# Patient Record
Sex: Female | Born: 1980
Health system: Southern US, Community
[De-identification: ages and names within clinical notes are randomized; demographics above are authoritative.]

## PROBLEM LIST (undated history)

## (undated) DIAGNOSIS — Q796 Ehlers-Danlos syndrome, unspecified: Secondary | ICD-10-CM

## (undated) DIAGNOSIS — F4310 Post-traumatic stress disorder, unspecified: Secondary | ICD-10-CM

## (undated) DIAGNOSIS — F1021 Alcohol dependence, in remission: Secondary | ICD-10-CM

## (undated) DIAGNOSIS — G4710 Hypersomnia, unspecified: Secondary | ICD-10-CM

## (undated) DIAGNOSIS — G47419 Narcolepsy without cataplexy: Secondary | ICD-10-CM

## (undated) DIAGNOSIS — F32A Depression, unspecified: Secondary | ICD-10-CM

## (undated) DIAGNOSIS — F329 Major depressive disorder, single episode, unspecified: Secondary | ICD-10-CM

## (undated) HISTORY — DX: Hypersomnia, unspecified: G47.10

## (undated) HISTORY — DX: Ehlers-Danlos syndrome, unspecified: Q79.60

## (undated) HISTORY — DX: Post-traumatic stress disorder, unspecified: F43.10

## (undated) HISTORY — DX: Alcohol dependence, in remission: F10.21

## (undated) HISTORY — DX: Depression, unspecified: F32.A

## (undated) HISTORY — DX: Narcolepsy without cataplexy: G47.419

## (undated) HISTORY — DX: Major depressive disorder, single episode, unspecified: F32.9

---

## 2008-03-18 ENCOUNTER — Encounter: Admission: RE | Admit: 2008-03-18 | Discharge: 2008-03-18 | Payer: Self-pay | Admitting: Family Medicine

## 2010-02-06 IMAGING — US US ABDOMEN COMPLETE
1 series · 14 of 25 positions shown · non-contrast
Comparison: None.

CLINICAL DATA: 27-year-old female nausea, evaluate for gallstones.

COMPLETE ABDOMINAL ULTRASOUND
TECHNIQUE: Complete abdominal ultrasound examination was performed
including evaluation of the liver, gallbladder, bile ducts,
pancreas, kidneys, spleen, IVC, and abdominal aorta.

[Series 1: us abdomen complete · 0.19mm/px · 14 of 79 slices shown]
[im 1/79]
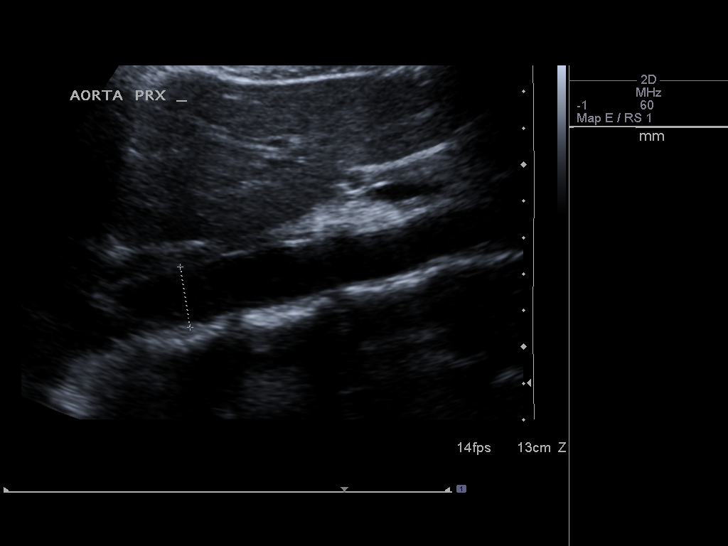
[im 7/79]
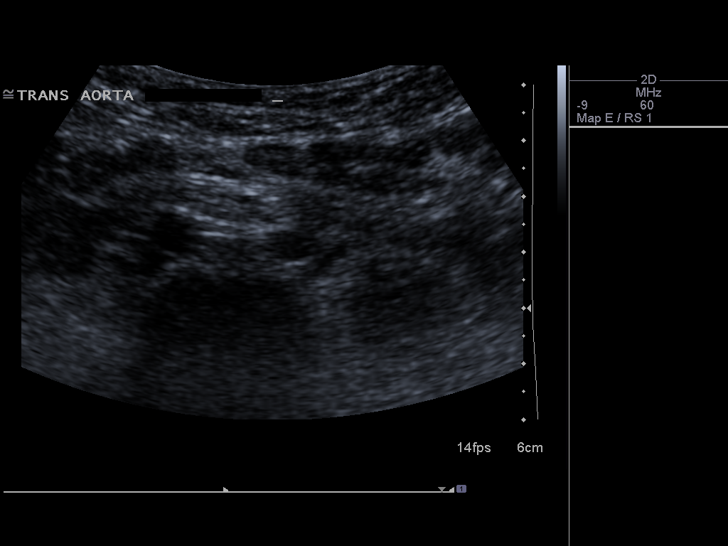
[im 14/79]
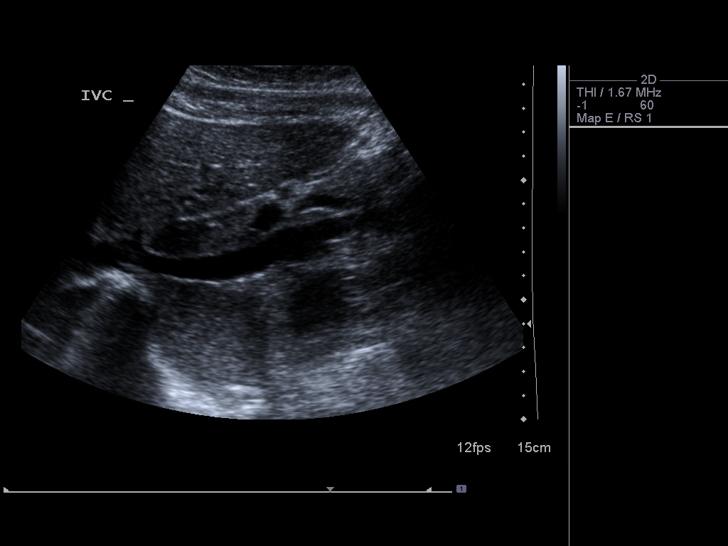
[im 20/79]
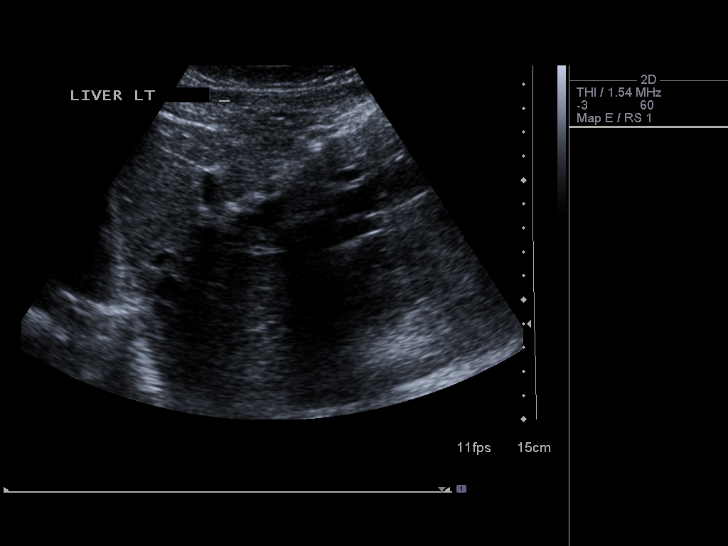
[im 27/79]
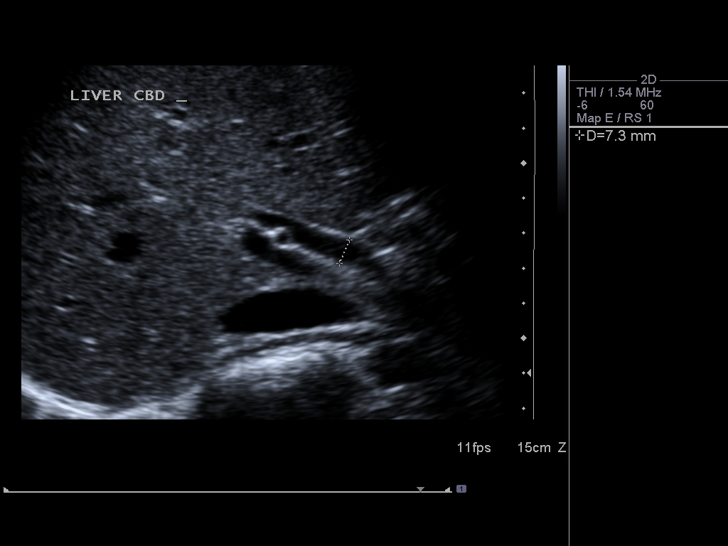
[im 30/79]
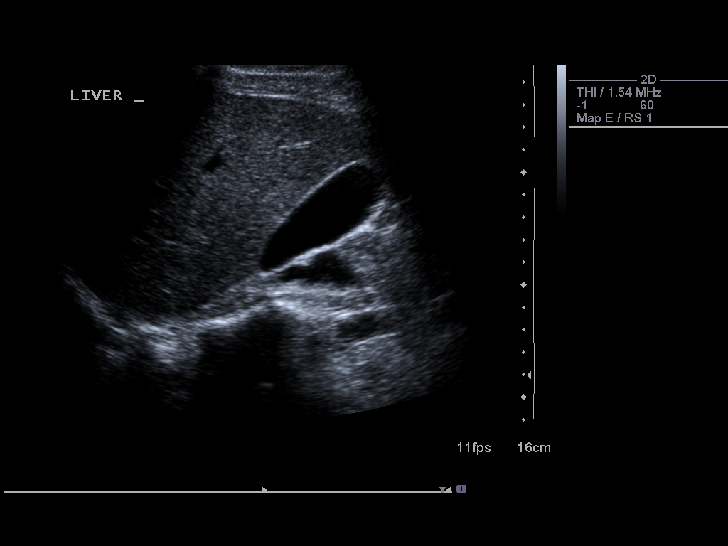
[im 36/79]
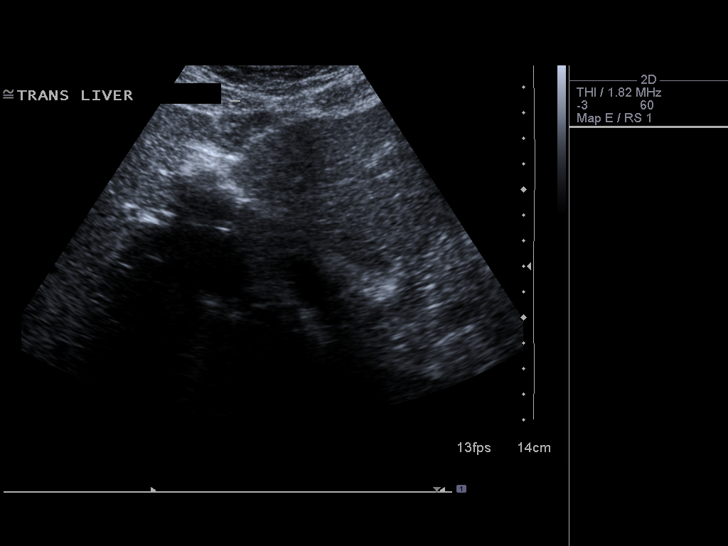
[im 43/79]
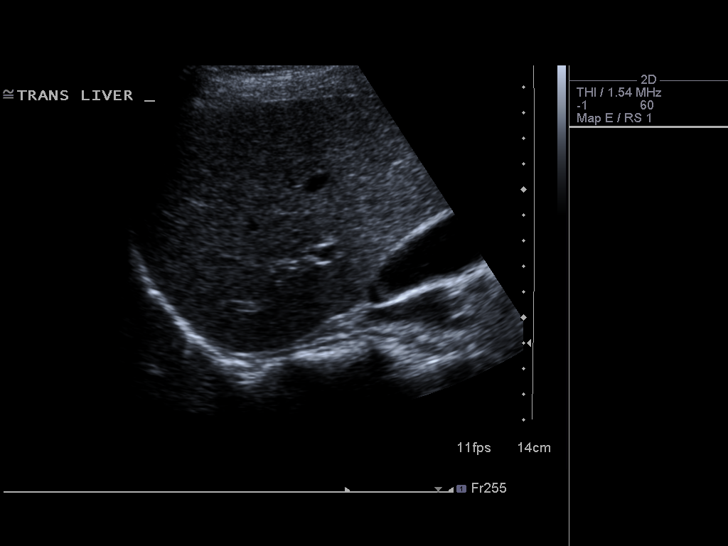
[im 49/79]
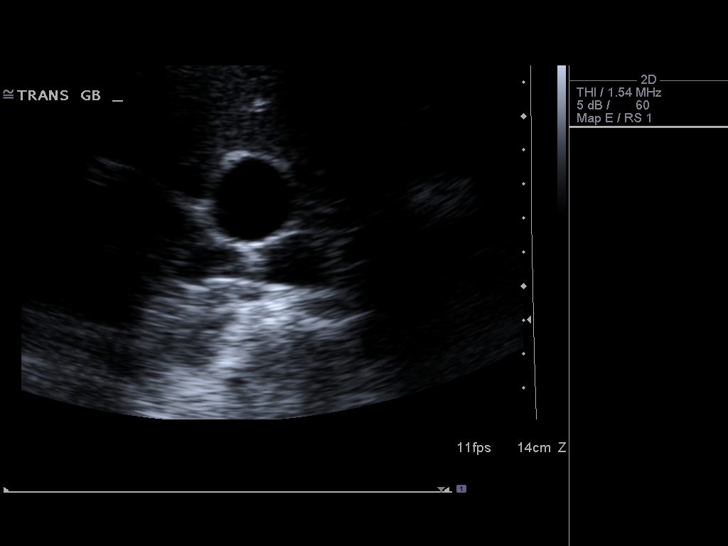
[im 53/79]
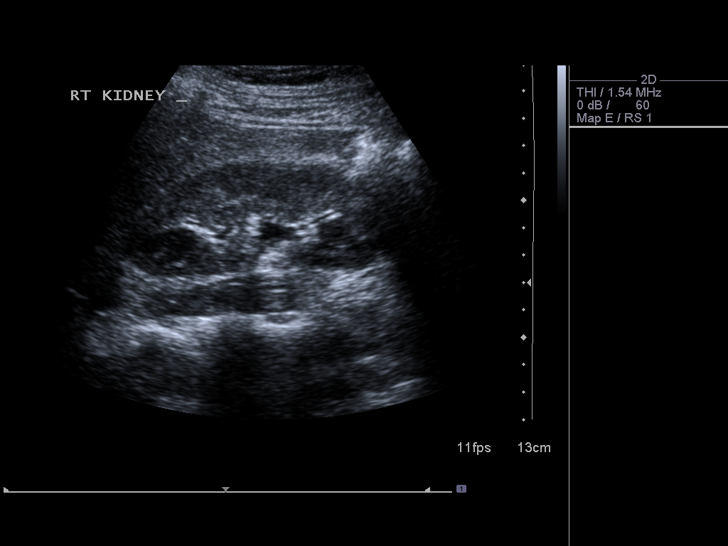
[im 59/79]
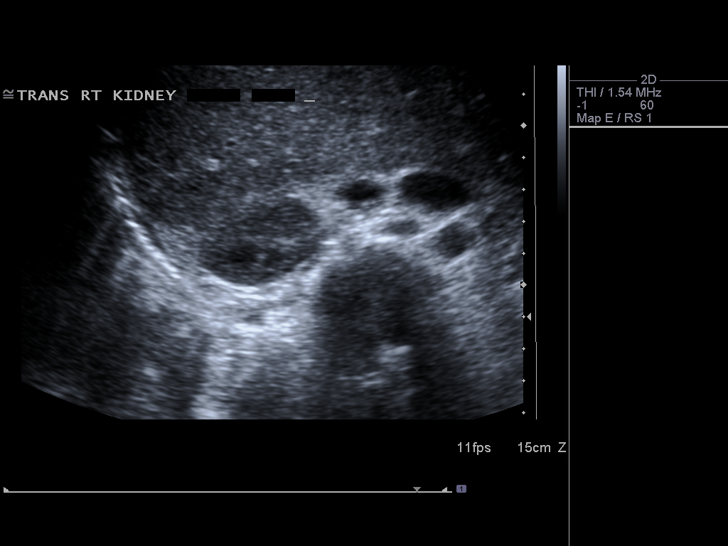
[im 66/79]
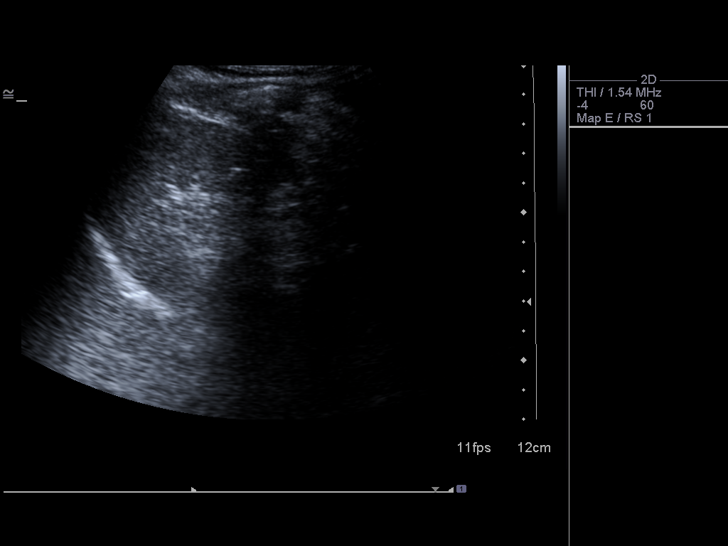
[im 72/79]
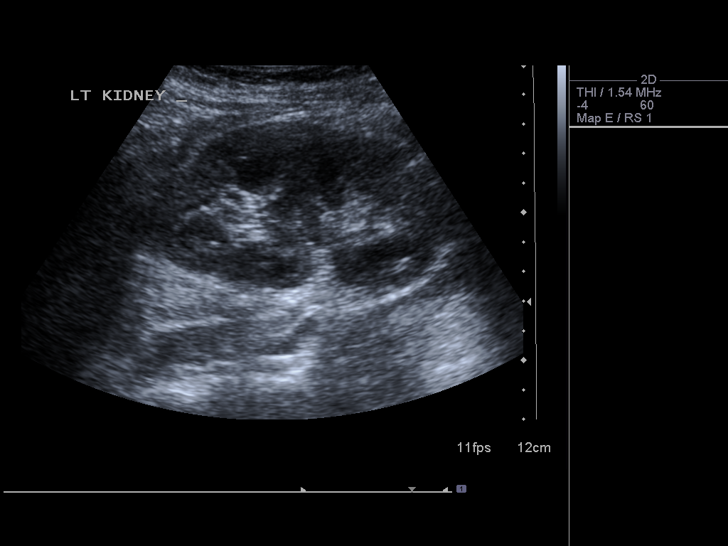
[im 79/79]
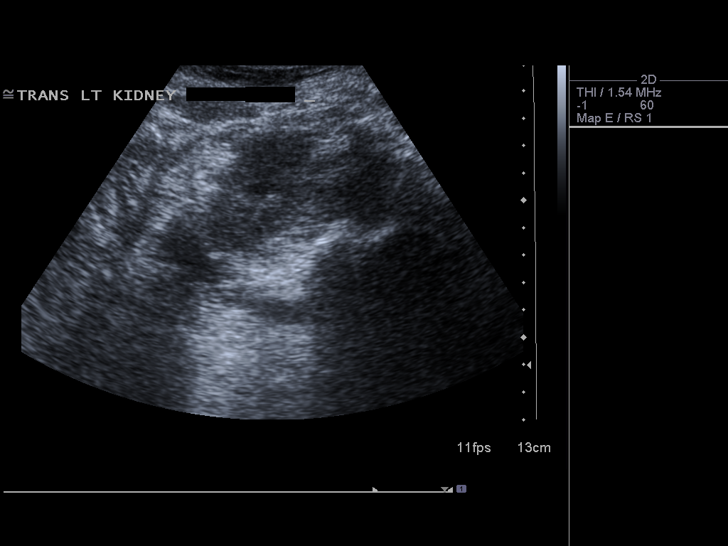

[14 of 25 positions shown; findings below may reference images not displayed]

FINDINGS: Gallbladder:  No gallstones, gallbladder wall thickening, or
pericholecystic fluid.

Common bile duct: Within normal limits in caliber.

Liver:  No focal parenchymal abnormalities.  Within normal limits
in parenchymal echogenicity.

Inferior vena cava:  Visualized portion unremarkable.

Pancreas:  Visualized portion unremarkable.

Spleen:  Within normal limits in size and echogenicity.

Right kidney:  Within normal limits in size and echogenicity. No
evidence of mass or hydronephrosis.

Left kidney:  Within normal limits in size and echogenicity. No
evidence of mass or hydronephrosis.

Abdominal aorta:  Within normal limits in caliber.
IMPRESSION: Negative abdominal ultrasound.

## 2010-10-07 HISTORY — PX: LASIK: SHX215

## 2012-08-22 ENCOUNTER — Other Ambulatory Visit: Payer: Self-pay

## 2012-08-22 MED ORDER — AMPHETAMINE-DEXTROAMPHETAMINE 20 MG PO TABS: ORAL_TABLET | ORAL | Status: DC

## 2012-08-22 NOTE — Telephone Encounter (Signed)
Patient called requesting a refill on Adderall.  Dr Vickey Huger is out of the office, forwarding request to Dr Terrace [facility]

## 2012-08-23 MED ORDER — AMPHETAMINE-DEXTROAMPHETAMINE 20 MG PO TABS: ORAL_TABLET | ORAL | Status: DC

## 2012-08-23 NOTE — Addendum Note (Signed)
Addended by: Lucille Passy C on: 08/23/2012 12:24 PM   Modules accepted: Orders

## 2012-08-23 NOTE — Telephone Encounter (Signed)
Rx did not print.  I reprinted it. 

## 2012-09-20 ENCOUNTER — Other Ambulatory Visit: Payer: Self-pay

## 2012-09-20 MED ORDER — AMPHETAMINE-DEXTROAMPHETAMINE 20 MG PO TABS: ORAL_TABLET | ORAL | Status: DC

## 2012-10-21 ENCOUNTER — Other Ambulatory Visit: Payer: Self-pay

## 2012-10-21 MED ORDER — AMPHETAMINE-DEXTROAMPHETAMINE 20 MG PO TABS: ORAL_TABLET | ORAL | Status: DC

## 2012-10-21 NOTE — Telephone Encounter (Signed)
Patient called, left message.  She would like a refill on Adderall.  She will pick Rx up when it's ready.

## 2012-11-21 ENCOUNTER — Encounter: Payer: Self-pay | Admitting: Neurology

## 2012-11-21 ENCOUNTER — Ambulatory Visit (INDEPENDENT_AMBULATORY_CARE_PROVIDER_SITE_OTHER): Payer: No Typology Code available for payment source | Admitting: Neurology

## 2012-11-21 VITALS — BP 115/77 | HR 88 | Resp 16 | Ht 63.0 in | Wt 113.0 lb

## 2012-11-21 DIAGNOSIS — Z5181 Encounter for therapeutic drug level monitoring: Secondary | ICD-10-CM

## 2012-11-21 DIAGNOSIS — G4710 Hypersomnia, unspecified: Secondary | ICD-10-CM | POA: Insufficient documentation

## 2012-11-21 LAB — COMPREHENSIVE METABOLIC PANEL
AST: 11 IU/L (ref 0–40)
Albumin: 4.6 g/dL (ref 3.5–5.5)
BUN: 17 mg/dL (ref 6–20)
CO2: 29 mmol/L (ref 18–29)
Calcium: 9.6 mg/dL (ref 8.7–10.2)
Creatinine, Ser: 0.97 mg/dL (ref 0.57–1.00)
Globulin, Total: 2.6 g/dL (ref 1.5–4.5)
Glucose: 88 mg/dL (ref 65–99)
Total Protein: 7.2 g/dL (ref 6.0–8.5)

## 2012-11-21 MED ORDER — SODIUM OXYBATE 500 MG/ML PO SOLN: 4500.0000 mg | Freq: Every day | ORAL | Status: DC

## 2012-11-21 MED ORDER — AMPHETAMINE-DEXTROAMPHETAMINE 20 MG PO TABS: ORAL_TABLET | ORAL | Status: DC

## 2012-11-21 NOTE — Progress Notes (Signed)
Quick Note:  All normal , please call. Arash Karstens, MD  ______

## 2012-11-21 NOTE — Progress Notes (Signed)
Chief Complaint  Patient presents with  . Hypersomnia    # 11 Revisit   [facility]  Provider:  Dr Vickey Huger Referring Provider: Henrine Screws, MD Primary Care Physician:  Gaye Alken, MD  Chief Complaint  Patient presents with  . Hypersomnia    # 11 Revisit    HPI:  Sherry Rhodes is a 32 y.o. female here originally  as a referral from Dr. Abigail Miyamoto for  possible narcolepsy.  She underwent testing by PSG and MSLT in 2009, and  Had no SREM onsets but a short sleep latency. She has been classified as a clinical Narcolepsy.   Patient had been taking Adderall for multiple decades before she without being evaluated for the possibility of narcolepsy. She had endorsed high fatigue scores and high Epworth sleepiness course at the time today her Epworth score is 11 points before the patient started on Xyrem in addition to Adderall her Epworth sleepiness score was 21/24 points.  The patient had been weaned off her stimulant medications as well as her antidepressants prior to the MSLT in August 2009 she had an AHI of 0.0 and the study was valid for an MS LT to follow ,  again there were no sleep onset REM periods , with a mean sleep latency of only 7.2 minutes. This was  indicative of a pathological level of sleepiness. She had to titrate slower than the average patient to her most effective Xyrem dose, and has now reached 4.5 g twice at night. She had nor desired effect on Provigil  or other modafinil preparations in the past and her Epworth score remained at 18 points and higher.     The patient's indicated that her regular bedtime is around 10 PM and she rises in the morning at 6 AM, on Xyrem she has at least 7 hours of nocturnal sleep she reports.  She has to the bathroom once before taking her second dose of Xyrem for the night. There has been no history of apnea and snoring, her PSG in 2009 documented  an AHI of 0.0 . No shift work history. Her  family  history of sleep disorders is positive for sleep apnea in a maternal grandmother and uncle.  She has never had trauma or surgery to the neck, jaw or sinuses. No TMJ , No alteration of the a upper airway. She wore braces in her teens, not for mandibular advancement. .     Review of Systems: Out of a complete 14 system review, the patient complains of only the following symptoms, and all other reviewed systems are negative. Sleepiness , epworth 11 .    Social History - Bachelor degree, working on her Master's degree in public affairs.  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   Family History  Problem Relation Age of Onset  . Parkinson's disease Maternal Grandmother   . Drug abuse Maternal Grandfather   . Cancer Paternal Grandmother   . Cancer Paternal Grandfather   . Drug abuse Paternal Grandfather    Past history-  Depression, controlled for over 10  years . Hypersomnia, persistent  Herpes genitalis.     Past Surgical History  Procedure Laterality Date  . Lasik Right 6/12    good success- vision correction    Current Outpatient Prescriptions  Medication Sig Dispense Refill  . acyclovir (ZOVIRAX) 200 MG capsule Take by mouth daily.      [name] amphetamine-dextroamphetamine (ADDERALL) 20 MG tablet Take  one tablet po QAM, one tablet at 12 noon and one half tablet at 4pm  75 tablet  0  . buPROPion (WELLBUTRIN XL) 300 MG 24 hr tablet Take 300 mg by mouth daily.      [name] lamoTRIgine (LAMICTAL) 100 MG tablet Take 100 mg by mouth daily.      [name] levonorgestrel (MIRENA) 20 MCG/24HR IUD 1 each by Intrauterine route once.      . loratadine (CLARITIN) 10 MG tablet Take 10 mg by mouth daily.      . Sodium Oxybate (XYREM) 500 MG/ML SOLN Take 9 mLs (4,500 mg total) by mouth daily.  270 mL  5   No current facility-administered medications for this visit.    Allergies as of 11/21/2012 - Review Complete 11/21/2012  Allergen Reaction Noted  . Bee  venom  11/21/2012    Vitals: BP 115/77  Pulse 88  Resp 16  Ht 5\' 3"  (1.6 m)  Wt 113 lb (51.256 kg)  BMI 20.02 kg/m2 Last Weight:  Wt Readings from Last 1 Encounters:  11/21/12 113 lb (51.256 kg)   Last Height:   Ht Readings from Last 1 Encounters:  11/21/12 5\' 3"  (1.6 m)   .  Physical exam:  General: The patient is awake, alert and appears not in acute distress. The patient is well groomed. Head: Normocephalic, atraumatic. Neck is supple. Mallampati 2 , neck circumference: 32 cm. No retrognathia , no TMJ.  Cardiovascular:  Regular rate and rhythm , without  murmurs or carotid bruit, and without distended neck veins. Respiratory: Lungs are clear to auscultation. Skin:  Without evidence of edema, or rash Trunk: BMI is normal posture.  Becks score 3 points.   Neurologic exam : The patient is awake and alert, oriented to place and time.  Memory subjective  described as intact. There is a normal attention span & concentration ability. Speech is fluent without  dysarthria, dysphonia or aphasia.  Mood and affect are appropriate.  Cranial nerves: Pupils are equal and briskly reactive to light. Funduscopic exam without  evidence of pallor or edema. Extraocular movements  in vertical and horizontal planes intact and without nystagmus. Visual fields by finger perimetry are intact. Hearing to finger rub intact.  Facial sensation intact to fine touch. Facial motor strength is symmetric and tongue and uvula move midline.  Motor exam:   Normal tone and normal muscle bulk and symmetric normal strength in all extremities.  Sensory:  Fine touch, pinprick and vibration were tested in all extremities. Proprioception is  normal.  Coordination: Rapid alternating movements in the fingers/hands is tested and normal. Finger-to-nose without evidence of ataxia, dysmetria or tremor.  Gait and station: Patient walks without assistive device . Deep tendon reflexes: in the  upper and lower extremities  are symmetric and intact.    Assessment:  After physical and neurologic examination, review of laboratory studies, PSG and MSLT - continue treatment for persistent hypersomnia, with the current use of Xyrem. The patient is still using Adderall as well.. She is using an immediate release 20 mg tablet of Adderall and longer the extended release form. Nuvigil  was without positive effect on the patient's sleepiness.   Plan:  Treatment plan and additional workup will be reviewed under Problem List. Continue XYREM 4.5 gram twice nightly -  Continue reg adderall for prn use.   Labs , CMET.  Information about narcolepsy and narcolepsy- like disorders provided.

## 2012-11-21 NOTE — Patient Instructions (Signed)
Narcolepsy Narcolepsy is a disabling neurological disorder of sleep regulation. It affects the control of sleep. It also affects the control of wakefulness. It is an interruption of the dreaming state of sleep. This state is known as REM or rapid eye movement sleep.  SYMPTOMS  The development, number, and severity of symptoms vary widely among people with the disorder. Symptoms generally begin between the ages of 15 and 30. The four classic symptoms of the disorder are:   Excessive daytime sleepiness.  Cataplexy. This is sudden, brief episodes of muscle weakness or paralysis. It is caused by strong emotions. Common strong emotions are laughter, anger, surprise, or anticipation.  Sleep paralysis. This is paralysis upon falling asleep or waking up.  Hallucinations. These are vivid dream-like images that occur at when you first fall asleep. Other symptoms include:   Unrelenting excessive sleepiness. This is usually the first and most obvious symptom.  Sleep attacks. Patients have strong sleep attacks throughout the day. These attacks can last for 30 seconds to more than 30 minutes. These happen no matter how much or how well the person slept the night before. These attacks end up making the person sleep at work and social events. The person can fall asleep while eating, talking, and driving. They also fall asleep at other out of place times.  Disturbed nighttime sleep.  Tossing and turning in bed.  Leg jerks.  Nightmares.  Waking up often. DIAGNOSIS  It's possible that genetics play a role in this disorder. Narcolepsy is not a rare disorder. It is often misdiagnosed. It is often diagnosed years after symptoms first appear. Early diagnosis and treatment are important. This help the physical and mental well-being of the patient. TREATMENT  There is no cure for narcolepsy. The symptoms can be controlled with behavioral and medical therapy. The excessive daytime sleepiness may be treated with  stimulant drugs. It may also be treated with the drug modafinil (Provigil). Cataplexy and other REM-sleep symptoms may be treated with antidepressant medications. Medications will reduce the symptoms. Medications will not ease symptoms entirely. Many available medications have side effects. Basic lifestyle changes may also reduce the symptoms. These changes include having regular sleep schedules and scheduled daytime naps. Other lifestyle changes include avoiding "over-stimulating" situations. Document Released: 04/14/2002 Document Revised: 07/17/2011 Document Reviewed: 04/24/2005 Exit[employer] Patient Information 2014 [employer].  

## 2012-11-22 NOTE — Progress Notes (Signed)
Quick Note:  Normal labs. Send patient a copy, please. ______ 

## 2012-11-25 NOTE — Progress Notes (Signed)
Quick Note:  Spoke to patient and relayed normal CMP, per Dr. Vickey Huger. ______

## 2012-12-24 ENCOUNTER — Other Ambulatory Visit: Payer: Self-pay

## 2012-12-24 DIAGNOSIS — G4710 Hypersomnia, unspecified: Secondary | ICD-10-CM

## 2012-12-24 MED ORDER — AMPHETAMINE-DEXTROAMPHETAMINE 20 MG PO TABS: ORAL_TABLET | ORAL | Status: DC

## 2012-12-24 NOTE — Telephone Encounter (Signed)
Patient called requesting a refill on Adderall.  She would like to pick up the Rx when it's ready.  Call back number [phone].  Dr Vickey Huger is out of the office.  Forwarding request to Dr Anne Hahn [facility]

## 2013-01-22 ENCOUNTER — Other Ambulatory Visit: Payer: Self-pay

## 2013-01-22 DIAGNOSIS — G4710 Hypersomnia, unspecified: Secondary | ICD-10-CM

## 2013-01-22 MED ORDER — AMPHETAMINE-DEXTROAMPHETAMINE 20 MG PO TABS: ORAL_TABLET | ORAL | Status: DC

## 2013-01-22 NOTE — Telephone Encounter (Signed)
Rx signed, ready for pick up.  I called the patient.  Got no answer.  Left message.  

## 2013-01-22 NOTE — Telephone Encounter (Signed)
Patient called requesting a refill on Adderall.  She would like to pick up the Rx when it's ready.  Call back number [phone]

## 2013-02-14 ENCOUNTER — Telehealth: Payer: Self-pay | Admitting: *Deleted

## 2013-02-14 NOTE — Telephone Encounter (Signed)
Calling for xyrem renewal.  Needing PA.   I called [phone] and they are to send PA form.

## 2013-02-19 ENCOUNTER — Telehealth: Payer: Self-pay

## 2013-02-19 NOTE — Telephone Encounter (Signed)
Patient called, left message inquiring about Xyrem. It requires a new prior auth since the cost of the medication has increased.  The prior auth has been faxed, and is pending ins response.  I called the patient back.  Got no answer.  Left message.

## 2013-02-20 ENCOUNTER — Other Ambulatory Visit: Payer: Self-pay

## 2013-02-20 DIAGNOSIS — G4710 Hypersomnia, unspecified: Secondary | ICD-10-CM

## 2013-02-20 NOTE — Telephone Encounter (Signed)
Patient called requesting a refill on Adderall.  She would like to pick up the Rx when it's ready.

## 2013-02-21 MED ORDER — AMPHETAMINE-DEXTROAMPHETAMINE 20 MG PO TABS: ORAL_TABLET | ORAL | Status: DC

## 2013-02-21 NOTE — Telephone Encounter (Signed)
Rx signed, ready for pick up.  I called the patient.  She is aware.  

## 2013-02-21 NOTE — Telephone Encounter (Signed)
Jess in pharmacy has spoken to pt yesterday.

## 2013-03-27 ENCOUNTER — Other Ambulatory Visit: Payer: Self-pay | Admitting: Neurology

## 2013-03-27 DIAGNOSIS — G4710 Hypersomnia, unspecified: Secondary | ICD-10-CM

## 2013-03-27 MED ORDER — AMPHETAMINE-DEXTROAMPHETAMINE 20 MG PO TABS: ORAL_TABLET | ORAL | Status: DC

## 2013-03-28 ENCOUNTER — Other Ambulatory Visit: Payer: Self-pay | Admitting: Neurology

## 2013-03-28 ENCOUNTER — Telehealth: Payer: Self-pay | Admitting: Neurology

## 2013-03-28 DIAGNOSIS — G4710 Hypersomnia, unspecified: Secondary | ICD-10-CM

## 2013-03-28 MED ORDER — AMPHETAMINE-DEXTROAMPHETAMINE 20 MG PO TABS: ORAL_TABLET | ORAL | Status: DC

## 2013-03-28 NOTE — Telephone Encounter (Signed)
Patient arrived at the office requesting refill, Dr. Vickey Huger printed one out and it was given to the patient. Patient signed for it and left.

## 2013-04-25 ENCOUNTER — Other Ambulatory Visit: Payer: Self-pay

## 2013-04-25 DIAGNOSIS — G4710 Hypersomnia, unspecified: Secondary | ICD-10-CM

## 2013-04-25 MED ORDER — AMPHETAMINE-DEXTROAMPHETAMINE 20 MG PO TABS: ORAL_TABLET | ORAL | Status: DC

## 2013-04-25 NOTE — Telephone Encounter (Signed)
Rx mailed.

## 2013-04-25 NOTE — Telephone Encounter (Signed)
Patient called requesting a refill on Adderall.  She would like to pick up te Rx when it's ready.  Call back number [phone]

## 2013-04-28 ENCOUNTER — Telehealth: Payer: Self-pay | Admitting: Neurology

## 2013-04-28 NOTE — Telephone Encounter (Signed)
By viewing the previous note, Rx said call patient for pick up, but it appears it was mailed in error.  I called the patient back.  Explianed Rx was mailed in error.  She said she would watch for it in the mail.

## 2013-04-28 NOTE — Telephone Encounter (Signed)
Patient calling to follow up on Adderall script. Checked up front and did not see a written script ready.

## 2013-05-27 ENCOUNTER — Encounter: Payer: Self-pay | Admitting: Neurology

## 2013-05-27 ENCOUNTER — Ambulatory Visit (INDEPENDENT_AMBULATORY_CARE_PROVIDER_SITE_OTHER): Payer: No Typology Code available for payment source | Admitting: Neurology

## 2013-05-27 ENCOUNTER — Encounter (INDEPENDENT_AMBULATORY_CARE_PROVIDER_SITE_OTHER): Payer: Self-pay

## 2013-05-27 VITALS — BP 109/68 | HR 91 | Resp 16 | Ht 64.5 in | Wt 115.0 lb

## 2013-05-27 DIAGNOSIS — G4710 Hypersomnia, unspecified: Secondary | ICD-10-CM

## 2013-05-27 DIAGNOSIS — Z5181 Encounter for therapeutic drug level monitoring: Secondary | ICD-10-CM

## 2013-05-27 DIAGNOSIS — G47419 Narcolepsy without cataplexy: Secondary | ICD-10-CM

## 2013-05-27 HISTORY — DX: Narcolepsy without cataplexy: G47.419

## 2013-05-27 MED ORDER — SODIUM OXYBATE 500 MG/ML PO SOLN: 4500.0000 mg | Freq: Every day | ORAL | Status: DC

## 2013-05-27 MED ORDER — AMPHETAMINE-DEXTROAMPHETAMINE 20 MG PO TABS: ORAL_TABLET | ORAL | Status: DC

## 2013-05-27 NOTE — Patient Instructions (Signed)
Narcolepsy Narcolepsy is a disabling neurological disorder of sleep regulation. It affects the control of sleep. It also affects the control of wakefulness. It is an interruption of the dreaming state of sleep. This state is known as REM or rapid eye movement sleep.  SYMPTOMS  The development, number, and severity of symptoms vary widely among people with the disorder. Symptoms generally begin between the ages of 15 and 30. The four classic symptoms of the disorder are:   Excessive daytime sleepiness.  Cataplexy. This is sudden, brief episodes of muscle weakness or paralysis. It is caused by strong emotions. Common strong emotions are laughter, anger, surprise, or anticipation.  Sleep paralysis. This is paralysis upon falling asleep or waking up.  Hallucinations. These are vivid dream-like images that occur at when you first fall asleep. Other symptoms include:   Unrelenting excessive sleepiness. This is usually the first and most obvious symptom.  Sleep attacks. Patients have strong sleep attacks throughout the day. These attacks can last for 30 seconds to more than 30 minutes. These happen no matter how much or how well the person slept the night before. These attacks end up making the person sleep at work and social events. The person can fall asleep while eating, talking, and driving. They also fall asleep at other out of place times.  Disturbed nighttime sleep.  Tossing and turning in bed.  Leg jerks.  Nightmares.  Waking up often. DIAGNOSIS  It's possible that genetics play a role in this disorder. Narcolepsy is not a rare disorder. It is often misdiagnosed. It is often diagnosed years after symptoms first appear. Early diagnosis and treatment are important. This help the physical and mental well-being of the patient. TREATMENT  There is no cure for narcolepsy. The symptoms can be controlled with behavioral and medical therapy. The excessive daytime sleepiness may be treated with  stimulant drugs. It may also be treated with the drug modafinil (Provigil). Cataplexy and other REM-sleep symptoms may be treated with antidepressant medications. Medications will reduce the symptoms. Medications will not ease symptoms entirely. Many available medications have side effects. Basic lifestyle changes may also reduce the symptoms. These changes include having regular sleep schedules and scheduled daytime naps. Other lifestyle changes include avoiding "over-stimulating" situations. Document Released: 04/14/2002 Document Revised: 07/17/2011 Document Reviewed: 04/24/2005 Exit[employer] Patient Information 2014 [employer].  

## 2013-05-27 NOTE — Addendum Note (Signed)
Addended by: Melvyn NovasHMEIER, Morine Kohlman on: 05/27/2013 01:51 PM   Modules accepted: Orders

## 2013-05-27 NOTE — Addendum Note (Signed)
Addended by: Melvyn NovasHMEIER, Rayneisha Bouza on: 05/27/2013 01:53 PM   Modules accepted: Orders

## 2013-05-27 NOTE — Progress Notes (Signed)
[facility]  Provider:  Larey Seat, M D  Referring Provider: Randa Lynn* Primary Care Physician:  Gerrit Heck, MD  Chief Complaint  Patient presents with  . Follow-up    Room 11              [facility]  Provider:  Dr Brett Fairy Referring Provider: Randa Lynn* Primary Care Physician:  Gerrit Heck, MD  Chief Complaint  Patient presents with  . Follow-up    Room 11    HPI:  Sherry Rhodes is a 33 y.o. female here originally  as a referral from Dr. Drema Dallas for  possible narcolepsy.   She underwent testing by PSG and MSLT in 2009, and  Had no SREM onsets but a very  short sleep latency. She has been classified as a clinical Narcolepsy, controlled on XYREM.[name]   Patient had been taking Adderall for multiple decades before she was  being evaluated for the possibility of narcolepsy.  She had endorsed high fatigue scores and high Epworth sleepiness course at the time today her Epworth score is 11 points.  Before the patient started on Xyrem in addition to Adderall her Epworth sleepiness score was 21/24 points.  The patient had been weaned off her stimulant medications as well as her antidepressants prior to the MSLT in August 2009 she had an AHI of 0.0 and the study was valid for an MS LT to follow ,  again there were no sleep onset REM periods , with a mean sleep latency of only 7.2 minutes. This was  indicative of a pathological level of sleepiness. She had to titrate slower than the average patient to her most effective Xyrem dose, and has now reached 4.5 g twice at night. She had nor desired effect on Provigil  or other modafinil preparations in the past and her Epworth score remained at 18 points and higher.     Todays visit : The patient and doors the Epworth Sleepiness Scale at 12 points at the vertex and severity as for her was endorsed at 33 points. Her last laboratory tests  revealed no hepatic abnormalities there was actually no abnormality seen at all a very good glomerular filtration rate was noted normal sodium levels potassium, chloride, CO2, albumin, AST and ALT. Pupils was 88 mg/dL and a fasting test. The review of systems does not and Doris problems or side effects and they have been nor hospitalizations or injuries or surgeries.   Medication list was endorsed for Adderall 20 mg she is taking the Adderall q d,  she has a half tablet optional at 4 PM. Wellbutrin 300 mg preferred as 150 mg 2 tablets per morning.  Mirena  Daily, Claritin when necessary  and Xyrem as taken at 9 ml or 4.5 g twice nightly.   She has not had vivid dreams as frequently or as violent as in the past, since being on XYREM.   No cataplexy has ever been experienced.    The patient's indicated that her regular bedtime is around 10 PM and she rises in the morning at 6 AM, on Xyrem she has at least 7 hours of nocturnal sleep she reports.  She has to the bathroom once before taking her second dose of Xyrem for the night. There has been no history of apnea and snoring, her PSG in 2009 documented  an AHI of 0.0 . No shift work history. Her  family history of sleep disorders is positive for sleep apnea in a  maternal grandmother and uncle.  She has never had trauma or surgery to the neck, jaw or sinuses.  No TMJ , No alteration of the a upper airway. She wore braces in her teens, not for mandibular advancement. .     Review of Systems: Out of a complete 14 system review, the patient complains of only the following symptoms, and all other reviewed systems are negative. Sleepiness , epworth 12 . FSS 43.   Social History - Bachelor degree, working on her Master's degree in public affairs.  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   Family History  Problem Relation Age of Onset  . Parkinson's disease Maternal Grandmother   . Drug abuse Maternal  Grandfather   . Cancer Paternal Grandmother   . Cancer Paternal Grandfather   . Drug abuse Paternal Grandfather    Past history-  Depression, controlled for over 10  years . Hypersomnia, persistent  Herpes genitalis.     Past Surgical History  Procedure Laterality Date  . Lasik Right 6/12    good success- vision correction    Current Outpatient Prescriptions  Medication Sig Dispense Refill  . acyclovir (ZOVIRAX) 200 MG capsule Take by mouth daily.      [name] amphetamine-dextroamphetamine (ADDERALL) 20 MG tablet Take one tablet po QAM, one tablet at 12 noon and one half tablet at 4pm  75 tablet  0  . buPROPion (WELLBUTRIN XL) 300 MG 24 hr tablet Take 300 mg by mouth daily.      [name] lamoTRIgine (LAMICTAL) 100 MG tablet Take 100 mg by mouth daily.      [name] levonorgestrel (MIRENA) 20 MCG/24HR IUD 1 each by Intrauterine route once.      . loratadine (CLARITIN) 10 MG tablet Take 10 mg by mouth daily.      . Sodium Oxybate (XYREM) 500 MG/ML SOLN Take 9 mLs (4,500 mg total) by mouth daily.  270 mL  5   No current facility-administered medications for this visit.    Allergies as of 05/27/2013 - Review Complete 05/27/2013  Allergen Reaction Noted  . Bee venom  11/21/2012    Vitals: BP 109/68  Pulse 91  Resp 16  Ht 5' 4.5" (1.638 m)  Wt 115 lb (52.164 kg)  BMI 19.44 kg/m2 Last Weight:  Wt Readings from Last 1 Encounters:  05/27/13 115 lb (52.164 kg)   Last Height:   Ht Readings from Last 1 Encounters:  05/27/13 5' 4.5" (1.638 m)   .  Physical exam:  General: The patient is awake, alert and appears not in acute distress. The patient is well groomed. Head: Normocephalic, atraumatic. Neck is supple. Mallampati 2 , neck circumference: 32 cm. No retrognathia , no TMJ.  Cardiovascular:  Regular rate and rhythm , without  murmurs or carotid bruit, and without distended neck veins. Respiratory: Lungs are clear to auscultation. Skin:  Without evidence of edema, or rash, no edema from  XYREM.  Trunk: BMI is normal , as is posture.  Beck's depression score 4 points.   Neurologic exam : The patient is awake and alert, oriented to place and time.  Memory subjective  described as intact. There is a normal attention span & concentration ability.  Speech is fluent without  dysarthria, dysphonia or aphasia.  Mood and affect are appropriate.  Cranial nerves: Pupils are equal and briskly reactive to light. Funduscopic exam without  evidence of pallor or edema.  Extraocular movements  in vertical  and horizontal planes intact and without nystagmus. Visual fields by finger perimetry are intact. Hearing to finger rub intact.  Facial sensation intact to fine touch. Facial motor strength is symmetric and tongue and uvula move midline.  Motor exam:   Normal tone and normal muscle bulk and symmetric normal strength in all extremities. Good grip strength.   Sensory:  Fine touch, pinprick and vibration were tested in all extremities. Proprioception is normal.  Coordination: Rapid alternating movements in the fingers/hands is tested and normal.  Finger-to-nose without evidence of ataxia, dysmetria or tremor.  Gait and station: Patient walks inimpaired, well balanced.  Deep tendon reflexes: in the  upper and lower extremities are symmetric and intact.    Assessment:  After physical and neurologic examination, review of laboratory studies, PSG and MSLT - continue treatment for persistent hypersomnia, with the current use of Xyrem.  The patient is still using Adderall as well.. She is using an immediate release 20 mg tablet of Adderall and longer the extended release form. Nuvigil  was without positive effect on the patient's sleepiness.   Plan:   Treatment plan and additional workup will be reviewed under Problem List. Continue XYREM 4.5 gram twice nightly -  Continue reg adderall for prn use.   Labs , CMET.  Information about narcolepsy and of narcolepsy- like disorders provided.    HLA test dicussed.

## 2013-06-24 ENCOUNTER — Other Ambulatory Visit: Payer: Self-pay | Admitting: *Deleted

## 2013-06-24 DIAGNOSIS — G4710 Hypersomnia, unspecified: Secondary | ICD-10-CM

## 2013-06-24 MED ORDER — AMPHETAMINE-DEXTROAMPHETAMINE 20 MG PO TABS: ORAL_TABLET | ORAL | Status: DC

## 2013-06-25 NOTE — Telephone Encounter (Signed)
Called patient and left message informing her that her Rx was ready to be picked up at the front desk and if she has any other problems, questions or concerns to call the office. °

## 2013-07-28 ENCOUNTER — Other Ambulatory Visit: Payer: Self-pay | Admitting: Neurology

## 2013-07-28 DIAGNOSIS — G4710 Hypersomnia, unspecified: Secondary | ICD-10-CM

## 2013-07-28 MED ORDER — AMPHETAMINE-DEXTROAMPHETAMINE 20 MG PO TABS: ORAL_TABLET | ORAL | Status: DC

## 2013-07-28 NOTE — Telephone Encounter (Signed)
Patient requesting refill of written script of Adderall, quantity of 75. Please call patient when ready for pick up.

## 2013-07-28 NOTE — Telephone Encounter (Signed)
Dr Dohmeier is out of the office.  Forwarding request to [facility].   

## 2013-07-29 NOTE — Telephone Encounter (Signed)
Patient was notified that her rx is ready for pickup at the front desk.  Patient verbalized understanding. 

## 2013-08-25 ENCOUNTER — Other Ambulatory Visit: Payer: Self-pay | Admitting: Neurology

## 2013-08-25 DIAGNOSIS — G4710 Hypersomnia, unspecified: Secondary | ICD-10-CM

## 2013-08-25 MED ORDER — AMPHETAMINE-DEXTROAMPHETAMINE 20 MG PO TABS: ORAL_TABLET | ORAL | Status: DC

## 2013-08-25 NOTE — Telephone Encounter (Signed)
Called pt and left message informing her that her Rx was ready to be picked up at the front desk and if she has any other problems, questions or concerns to call the office.  °

## 2013-08-25 NOTE — Telephone Encounter (Signed)
Pt called to get her written prescription for amphetamine-dextroamphetamine (ADDERALL) 20 MG tablet please call pt when ready for pick up. Thanks

## 2013-09-26 ENCOUNTER — Other Ambulatory Visit: Payer: Self-pay | Admitting: Nurse Practitioner

## 2013-09-26 DIAGNOSIS — G4710 Hypersomnia, unspecified: Secondary | ICD-10-CM

## 2013-09-26 NOTE — Telephone Encounter (Signed)
Pt requesting refill of amphetamine-dextroamphetamine (ADDERALL) 20 MG tablet.  Please call when ready for pick up. Thanks

## 2013-09-30 MED ORDER — AMPHETAMINE-DEXTROAMPHETAMINE 20 MG PO TABS: ORAL_TABLET | ORAL | Status: DC

## 2013-09-30 NOTE — Telephone Encounter (Signed)
Called pt to inform her that her Rx was ready to be picked up at the front desk and if she has any other problems, questions or concerns to call the office. Pt verbalized understanding. °

## 2013-10-15 ENCOUNTER — Other Ambulatory Visit: Payer: Self-pay | Admitting: Neurology

## 2013-10-15 DIAGNOSIS — G47419 Narcolepsy without cataplexy: Secondary | ICD-10-CM

## 2013-10-15 DIAGNOSIS — G4710 Hypersomnia, unspecified: Secondary | ICD-10-CM

## 2013-10-15 MED ORDER — SODIUM OXYBATE 500 MG/ML PO SOLN: 4500.0000 mg | Freq: Every day | ORAL | Status: DC

## 2013-10-15 NOTE — Addendum Note (Signed)
Addended by: Demetrios Loll on: 10/15/2013 11:06 AM   Modules accepted: Orders

## 2013-11-03 ENCOUNTER — Other Ambulatory Visit: Payer: Self-pay | Admitting: Neurology

## 2013-11-03 DIAGNOSIS — G4710 Hypersomnia, unspecified: Secondary | ICD-10-CM

## 2013-11-03 MED ORDER — AMPHETAMINE-DEXTROAMPHETAMINE 20 MG PO TABS: ORAL_TABLET | ORAL | Status: DC

## 2013-11-03 NOTE — Telephone Encounter (Signed)
Patient calling to get a written Rx for Adderall 20mg  #75--please call when ready for pick up--thank you.

## 2013-11-03 NOTE — Telephone Encounter (Signed)
Please advise 

## 2013-11-03 NOTE — Telephone Encounter (Signed)
Request forwarded to Makalia Bare for approval  

## 2013-11-03 NOTE — Telephone Encounter (Signed)
Called pt and left message informing her that her Rx was ready to be picked up at the front desk and if she has any other problems, questions or concerns to call the office.  °

## 2013-11-25 ENCOUNTER — Encounter: Payer: Self-pay | Admitting: Adult Health

## 2013-11-25 ENCOUNTER — Ambulatory Visit (INDEPENDENT_AMBULATORY_CARE_PROVIDER_SITE_OTHER): Payer: No Typology Code available for payment source | Admitting: Adult Health

## 2013-11-25 VITALS — BP 112/73 | HR 92 | Ht 64.5 in | Wt 111.0 lb

## 2013-11-25 DIAGNOSIS — G47419 Narcolepsy without cataplexy: Secondary | ICD-10-CM

## 2013-11-25 DIAGNOSIS — Z5181 Encounter for therapeutic drug level monitoring: Secondary | ICD-10-CM

## 2013-11-25 DIAGNOSIS — G4710 Hypersomnia, unspecified: Secondary | ICD-10-CM

## 2013-11-25 MED ORDER — AMPHETAMINE-DEXTROAMPHETAMINE 20 MG PO TABS: ORAL_TABLET | ORAL | Status: DC

## 2013-11-25 NOTE — Progress Notes (Signed)
PATIENT: Sherry Rhodes DOB: 08/07/1980  REASON FOR VISIT: follow up HISTORY FROM: patient  HISTORY OF PRESENT ILLNESS: Sherry Rhodes is a 33 year old female with a history of narcolepsy. She returns today for follow-up. She currently takes xyrem and it is working well however she is having some depression. She states that she has no motivation to do things. Her PCP placed on her Effexor and she is going to see if that helps.  Her Epworth score is 10 previously was 12 and fatigue severity score is 46 was previously 43. She goes to bed around 10pm and arises at 6am. Patient takes Adderall at 8am, 1200 and 4pm. She states that is currently working well for her. No new medical issues since last seen.    REVIEW OF SYSTEMS: Full 14 system review of systems performed and notable only for:  Constitutional: N/A  Eyes: N/A Ear/Nose/Throat: N/A  Skin: N/A  Cardiovascular: N/A  Respiratory: N/A  Gastrointestinal: N/A  Genitourinary: N/A Hematology/Lymphatic: N/A  Endocrine: N/A Musculoskeletal:N/A  Allergy/Immunology: N/A  Neurological: N/A Psychiatric: depression Sleep: N/A   ALLERGIES: Allergies  Allergen Reactions  . Bee Venom     HOME MEDICATIONS: Outpatient Prescriptions Prior to Visit  Medication Sig Dispense Refill  . acyclovir (ZOVIRAX) 200 MG capsule Take by mouth daily.      [name]. amphetamine-dextroamphetamine (ADDERALL) 20 MG tablet Take one tablet po QAM, one tablet at 12 noon and one half tablet at 4pm  75 tablet  0  . buPROPion (WELLBUTRIN XL) 300 MG 24 hr tablet Take 450 mg by mouth daily.       [name]. lamoTRIgine (LAMICTAL) 100 MG tablet Take 100 mg by mouth daily.      [name]. levonorgestrel (MIRENA) 20 MCG/24HR IUD 1 each by Intrauterine route once.      . loratadine (CLARITIN) 10 MG tablet Take 10 mg by mouth daily.      . Sodium Oxybate (XYREM) 500 MG/ML SOLN Take 9 mLs (4,500 mg total) by mouth daily. Take twice at night 4.5 gram , 4500 mg or 9 ml. .  540 mL  5   No  facility-administered medications prior to visit.    PAST MEDICAL HISTORY: Past Medical History  Diagnosis Date  . Recovering alcoholic   . Depression   . EDS (Ehlers-Danlos syndrome)     persistent excessive daytime sleepiness  . PTSD (post-traumatic stress disorder)   . Hypersomnia, persistent     MSLT no SREM but lass than 8 minutes SL.   [name]. Controlled narcolepsy 05/27/2013    PAST SURGICAL HISTORY: Past Surgical History  Procedure Laterality Date  . Lasik Right 6/12    good success- vision correction    FAMILY HISTORY: Family History  Problem Relation Age of Onset  . Parkinson's disease Maternal Grandmother   . Drug abuse Maternal Grandfather   . Cancer Paternal Grandmother   . Cancer Paternal Grandfather   . Drug abuse Paternal Grandfather     SOCIAL HISTORY: History   Social History  . Marital Status: Single    Spouse Name: N/A    Number of Children: 0  . Years of Education: Bachelors   Occupational History  .  [employer]   Social History Main Topics  . Smoking status: Former Games developermoker  . Smokeless tobacco: Never Used     Comment: quit smoking 3/10  . Alcohol Use: No     Comment: quit alcohol 02/2003-recovering alcohol abuser  . Drug Use: No  . Sexual Activity:  Not on file   Other Topics Concern  . Not on file   Social History Narrative       Patient is single, recovering ETOH abuser. Patient has her bachelors degree  Patient is working on her masters at [employer].. 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    Patient is right-handed.   Patient does not drink any caffeine.                  PHYSICAL EXAM  Filed Vitals:   11/25/13 1017  BP: 112/73  Pulse: 92  Height: 5' 4.5" (1.638 m)  Weight: 111 lb (50.349 kg)   Body mass index is 18.77 kg/(m^2).  Generalized: Well developed, in no acute distress   Neurological examination  Mentation: Alert oriented to time, place, history taking. Follows all commands speech and language fluent Cranial  nerve II-XII: . Extraocular movements were full, visual field were full on confrontational test.  Motor: The motor testing reveals 5 over 5 strength of all 4 extremities. Good symmetric motor tone is noted throughout.  Sensory: Sensory testing is intact to soft touch on all 4 extremities. No evidence of extinction is noted.  Coordination: Cerebellar testing reveals good finger-nose-finger and heel-to-shin bilaterally.  Gait and station: Gait is normal. Tandem gait is normal. Romberg is negative. No drift is seen.  Reflexes: Deep tendon reflexes are symmetric and normal bilaterally.    DIAGNOSTIC DATA (LABS, IMAGING, TESTING) - I reviewed patient records, labs, notes, testing and imaging myself where available.      Component Value Date/Time   NA 138 11/21/2012 1406   K 3.9 11/21/2012 1406   CL 100 11/21/2012 1406   CO2 29 11/21/2012 1406   GLUCOSE 88 11/21/2012 1406   BUN 17 11/21/2012 1406   CREATININE 0.97 11/21/2012 1406   CALCIUM 9.6 11/21/2012 1406   PROT 7.2 11/21/2012 1406   AST 11 11/21/2012 1406   ALT 11 11/21/2012 1406   ALKPHOS 52 11/21/2012 1406   BILITOT 0.2 11/21/2012 1406   GFRNONAA 78 11/21/2012 1406   GFRAA 89 11/21/2012 1406       ASSESSMENT AND PLAN 33 y.o. year old female  has a past medical history of Recovering alcoholic; Depression; EDS (Ehlers-Danlos syndrome); PTSD (post-traumatic stress disorder); Hypersomnia, persistent; and Controlled narcolepsy (05/27/2013). here with:  1. Narcolepsy 2. Hypersomnia  Narcolepsy is controlled with xyrem and Adderall. Patient however starting to experience some depression, she believes it is side effect of xyrem. Her PCP started Effexor. If the Effexor is not beneficial is controlled depression then xyrem may have to be discontinued. Patient advised to keep [place] informed as to how the Effexor is working for her. I will refill the Adderall today. Patient will get blood work while in the office today. Patient should follow-up in 6 months  or sooner if needed.   Butch Penny, MSN, NP-C 11/25/2013, 10:27 AM [facility] [address] [place], [place] [address] [phone]  Note: This document was prepared with digital dictation and possible smart phrase technology. Any transcriptional errors that result from this process are unintentional.

## 2013-11-25 NOTE — Patient Instructions (Signed)
Sodium Oxybate oral solution What is this medicine? SODIUM OXYBATE (SOE dee um OX i bate) is used to treat excessive sleepiness and cataplexy in patients with narcolepsy. Cataplexy causes a sudden muscle weakness due to a strong emotional response. This medicine is not available in retail pharmacies. Your doctor will enroll you in a program that will provide the drug to you. This medicine may be used for other purposes; ask your health care provider or pharmacist if you have questions. COMMON BRAND NAME(S): Xyrem What should I tell my health care provider before I take this medicine? They need to know if you have any of these conditions: -depression, psychosis, or other mood disorders -heart disease or high blood pressure -if you frequently drink alcohol containing beverages -if you have a history of drug or alcohol abuse -liver disease -lung disease or difficulty breathing -seizures -succinic semialdehyde dehydrogenase deficiency -thoughts of suicide -an unusual or allergic reaction to sodium oxybate, other medicines, foods, dyes, or preservatives -pregnant or trying to get pregnant -breast-feeding How should I use this medicine? Take this medicine by mouth. Follow the directions on the prescription label. Take this medicine on an empty stomach, at least 30 minutes before or 2 hours after food. Do not take with food. Do not take your medicine more often than directed. A special MedGuide will be given to you by the pharmacist with each prescription and refill. Be sure to read this information carefully each time. Talk to your pediatrician regarding the use of this medicine in children. Special care may be needed. Overdosage: If you think you have taken too much of this medicine contact a poison control center or emergency room at once. NOTE: This medicine is only for you. Do not share this medicine with others. What if I miss a dose? Skip the missed dose. If it is almost time for your next  dose, take only that dose. Allow at least two and one-half hours between each nightly dose. Do not take double or extra doses. What may interact with this medicine? Do not take this medicine with any of the following medications: -alcohol -barbiturates, like phenobarbital -medicines commonly used for anxiety, sedation or insomnia This medicine may also interact with the following medications: -bupropion -divalproex sodium -dronabinol or marijuana -medicines for psychosis or severe mood disturbances -muscle relaxants -other stimulants, although these are commonly used with sodium oxybate -prescription pain medicines, including tramadol -valproate or valproic acid This list may not describe all possible interactions. Give your health care provider a list of all the medicines, herbs, non-prescription drugs, or dietary supplements you use. Also tell them if you smoke, drink alcohol, or use illegal drugs. Some items may interact with your medicine. What should I watch for while using this medicine? The use of this medicine requires careful supervision. Visit your doctor or health care professional for regular checks on your progress. Do not suddenly stop taking this medicine if you have been taking it for a long time. Withdrawal symptoms may occur. Your doctor or health care professional may need to slowly stop your doses. This medicine may affect your concentration or function. Let your doctor or health care professional know if you have increased sleepiness or confusion during the day. This medicine causes sleep very quickly. You should only take this drug at bedtime, while in bed. Do not drive a car, operate heavy machinery or perform any activities that require mental alertness for at least 6 hours after taking this drug. Use extreme care in any such   daily activities until you know how this medicine affects you. Because alcohol may interfere with this medicine and may cause serious side effects,  you must avoid alcohol-containing beverages while on this medicine. Do not take this medicine along with sleep medicines or other drugs with strong sedative effects, serious side effects may occur. This medicine can be dangerous in overdose. If you take more than prescribed or take it by accident, get emergency medical help right away. What side effects may I notice from receiving this medicine? Side effects that you should report to your doctor or health care professional as soon as possible: -allergic reactions like skin rash, itching or hives, swelling of the face, lips, or tongue -breathing problems -confusion -fast, irregular heartbeat -increased blood pressure, particularly if you already have high blood pressure -memory loss -seizures -sleepwalking -tremors or shaking movements -urinary incontinence Side effects that usually do not require medical attention (report to your doctor or health care professional if they continue or are bothersome): -dizziness -drowsiness -headache -increased urination -nausea, vomiting or stomach upset -unusual dreams This list may not describe all possible side effects. Call your doctor for medical advice about side effects. You may report side effects to FDA at 1-800-FDA-1088. Where should I keep my medicine? Keep out of reach of children. This medicine can be abused. Keep your medicine in a safe place to protect it from theft. Do not share this medicine with anyone. Selling or giving away this medicine is dangerous and against the law. Store at room temperature between 15 and 30 degrees C (59 and 86 degrees F). This medicine may cause accidental overdose and death if it is taken by other adults, children, or pets. Flush any unused medicine down the toilet to reduce the chance of harm. Do not use the medicine after the expiration date. NOTE: This sheet is a summary. It may not cover all possible information. If you have questions about this medicine,  talk to your doctor, pharmacist, or health care provider.  2015, Elsevier/Gold Standard. (2012-11-20 15:45:13)  

## 2013-11-26 LAB — COMPREHENSIVE METABOLIC PANEL
A/G RATIO: 1.6 (ref 1.1–2.5)
ALT: 9 IU/L (ref 0–32)
AST: 11 IU/L (ref 0–40)
Albumin: 4.4 g/dL (ref 3.5–5.5)
Alkaline Phosphatase: 49 IU/L (ref 39–117)
BILIRUBIN TOTAL: 0.3 mg/dL (ref 0.0–1.2)
BUN/Creatinine Ratio: 14 (ref 8–20)
BUN: 15 mg/dL (ref 6–20)
CALCIUM: 9.4 mg/dL (ref 8.7–10.2)
CHLORIDE: 100 mmol/L (ref 96–108)
CO2: 28 mmol/L (ref 18–29)
Creatinine, Ser: 1.06 mg/dL — ABNORMAL HIGH (ref 0.57–1.00)
GFR, EST AFRICAN AMERICAN: 80 mL/min/{1.73_m2} (ref >59)
GFR, EST NON AFRICAN AMERICAN: 69 mL/min/{1.73_m2} (ref >59)
GLUCOSE: 86 mg/dL (ref 65–99)
Globulin, Total: 2.8 g/dL (ref 1.5–4.5)
POTASSIUM: 4.4 mmol/L (ref 3.5–5.2)
SODIUM: 138 mmol/L (ref 134–144)
TOTAL PROTEIN: 7.2 g/dL (ref 6.0–8.5)

## 2013-11-26 NOTE — Progress Notes (Signed)
Quick Note:  Shared normal labs thru VM message ______ 

## 2014-01-05 ENCOUNTER — Other Ambulatory Visit: Payer: Self-pay | Admitting: Adult Health

## 2014-01-05 DIAGNOSIS — G4710 Hypersomnia, unspecified: Secondary | ICD-10-CM

## 2014-01-05 MED ORDER — AMPHETAMINE-DEXTROAMPHETAMINE 20 MG PO TABS
ORAL_TABLET | ORAL | Status: DC
Start: 2014-01-05 — End: 2014-02-06

## 2014-01-05 NOTE — Telephone Encounter (Signed)
Patient requesting written script refill of Adderall 20 mg, quantity of 75. Please call patient and advise.

## 2014-01-05 NOTE — Telephone Encounter (Signed)
Rx entered and forwarded to provider for approval 

## 2014-01-06 NOTE — Telephone Encounter (Signed)
Called pt and left message informing pt that her Rx was ready to be picked up at the front desk and if she has any other problems, questions or concerns to call the office. °

## 2014-02-06 ENCOUNTER — Telehealth: Payer: Self-pay | Admitting: *Deleted

## 2014-02-06 ENCOUNTER — Telehealth: Payer: Self-pay

## 2014-02-06 DIAGNOSIS — G4710 Hypersomnia, unspecified: Secondary | ICD-10-CM

## 2014-02-06 MED ORDER — AMPHETAMINE-DEXTROAMPHETAMINE 20 MG PO TABS: ORAL_TABLET | ORAL | Status: DC

## 2014-02-06 NOTE — Telephone Encounter (Signed)
Called pt and left message informing her that her Rx was ready to be picked up at the front desk and that the office is closing at 12 pm today and if she can not make it, the office will reopen on Monday at 8 am and if she has any other problems, questions or concerns to call the office.

## 2014-02-06 NOTE — Telephone Encounter (Signed)
[employer] notified us they have approved our request for coverage on Xyrem effective until 07/31/2014 Ref # [id number]

## 2014-02-06 NOTE — Telephone Encounter (Signed)
Patient calling requesting a refill of Adderall 20 mg, written RX quantity of 75

## 2014-02-06 NOTE — Telephone Encounter (Signed)
I have refilled this medication. Its ready for pickup. Maida SaleCathy Rhodes is calling the patient to let her know.

## 2014-03-11 ENCOUNTER — Other Ambulatory Visit: Payer: Self-pay | Admitting: Neurology

## 2014-03-11 DIAGNOSIS — G4710 Hypersomnia, unspecified: Secondary | ICD-10-CM

## 2014-03-11 MED ORDER — AMPHETAMINE-DEXTROAMPHETAMINE 20 MG PO TABS: ORAL_TABLET | ORAL | Status: DC

## 2014-03-11 NOTE — Telephone Encounter (Signed)
Request entered, forwarded to provider for approval.  

## 2014-03-11 NOTE — Telephone Encounter (Signed)
Patient requesting refill of Adderall script, please call when ready for pick up.  °

## 2014-03-12 NOTE — Telephone Encounter (Signed)
I called the patient to let them know their Rx for Adderall was ready for pickup. Patient was instructed to bring Photo ID. 

## 2014-03-12 NOTE — Addendum Note (Signed)
Addended by: Arlis PortaHUGHES, Arvie Villarruel on: 03/12/2014 09:59 AM   Modules accepted: Orders, Medications

## 2014-04-20 ENCOUNTER — Telehealth: Payer: Self-pay | Admitting: Neurology

## 2014-04-20 NOTE — Telephone Encounter (Signed)
Patient is calling to get a written Rx for Adderall. Please call patient when ready for pickup.Thank you. °

## 2014-04-21 ENCOUNTER — Other Ambulatory Visit: Payer: Self-pay

## 2014-04-21 DIAGNOSIS — G4710 Hypersomnia, unspecified: Secondary | ICD-10-CM

## 2014-04-21 MED ORDER — AMPHETAMINE-DEXTROAMPHETAMINE 20 MG PO TABS: ORAL_TABLET | ORAL | Status: DC

## 2014-04-21 NOTE — Telephone Encounter (Signed)
I called the patient to let them know their Rx for Adderall was ready for pickup. Patient was instructed to bring Photo ID. 

## 2014-04-21 NOTE — Telephone Encounter (Signed)
Request forwarded to provider for approval  

## 2014-05-29 ENCOUNTER — Ambulatory Visit: Payer: No Typology Code available for payment source | Admitting: Adult Health

## 2014-06-01 ENCOUNTER — Other Ambulatory Visit: Payer: Self-pay | Admitting: Neurology

## 2014-06-01 ENCOUNTER — Telehealth: Payer: Self-pay

## 2014-06-01 DIAGNOSIS — G4710 Hypersomnia, unspecified: Secondary | ICD-10-CM

## 2014-06-01 MED ORDER — AMPHETAMINE-DEXTROAMPHETAMINE 20 MG PO TABS: ORAL_TABLET | ORAL | Status: DC

## 2014-06-01 NOTE — Telephone Encounter (Signed)
Called patient and informed Rx ready for pick up at front desk. Patient verbalized understanding.  

## 2014-06-01 NOTE — Telephone Encounter (Signed)
Request entered, forwarded to provider for approval.  

## 2014-06-01 NOTE — Telephone Encounter (Signed)
Patient is calling to get a written Rx for Adderall 20 mg quantity 75.  Please call.

## 2014-06-09 ENCOUNTER — Ambulatory Visit (INDEPENDENT_AMBULATORY_CARE_PROVIDER_SITE_OTHER): Payer: BLUE CROSS/BLUE SHIELD | Admitting: Adult Health

## 2014-06-09 ENCOUNTER — Encounter: Payer: Self-pay | Admitting: Adult Health

## 2014-06-09 VITALS — BP 105/67 | HR 96 | Ht 64.0 in | Wt 128.0 lb

## 2014-06-09 DIAGNOSIS — R251 Tremor, unspecified: Secondary | ICD-10-CM

## 2014-06-09 DIAGNOSIS — Z5181 Encounter for therapeutic drug level monitoring: Secondary | ICD-10-CM

## 2014-06-09 DIAGNOSIS — G47419 Narcolepsy without cataplexy: Secondary | ICD-10-CM

## 2014-06-09 NOTE — Progress Notes (Signed)
I agree with the assessment and plan as directed by NP .The patient is known to me .   Sanaz Scarlett, MD  

## 2014-06-09 NOTE — Patient Instructions (Signed)
Continue Xyrem.  Please speak with your PCP about effexor causing tremor and balance issues.  If depression worsens, please let us know, xyrem may need to be discontinued.  I will check blood work today and call you with results.

## 2014-06-09 NOTE — Progress Notes (Signed)
PATIENT: Sherry Rhodes DOB: 01/18/1981  REASON FOR VISIT: follow up- narcolepsy HISTORY FROM: patient  HISTORY OF PRESENT ILLNESS: Sherry Rhodes is a 34 year old female with a history of narcolepsy. She returns today for follow-up. She currently takes xyrem and it is working well. She does report that she has a hard time getting up in the morning. This is not a new finding. She states that prior to Xyrem she was not waking up easily and refreshed. She states that once she is up and moving she  feels awake.  At the last visit she reported some depression and had been started on Effexor. She reports that her depression has been under control with Effexor. However she has developed a significant tremor for the Effexor. Tremor is located in bilateral upper and lower extremities.  she also takes Adderall TID, she states that occasionally she will not take the 4:00 dose. Epworth is 8 was previosuly 10 and fatigue severity score is 49 was previously 46.   HISTORY 11/25/13: Sherry Rhodes is a 34 year old female with a history of narcolepsy. She returns today for follow-up. She currently takes xyrem and it is working well however she is having some depression. She states that she has no motivation to do things. Her PCP placed on her Effexor and she is going to see if that helps. Her Epworth score is 10 previously was 12 and fatigue severity score is 46 was previously 43. She goes to bed around 10pm and arises at 6am. Patient takes Adderall at 8am, 1200 and 4pm. She states that is currently working well for her. No new medical issues since last seen.    REVIEW OF SYSTEMS: Out of a complete 14 system review of symptoms, the patient complains only of the following symptoms, and all other reviewed systems are negative.  See HPI  ALLERGIES: Allergies  Allergen Reactions  . Bee Venom     HOME MEDICATIONS: Outpatient Prescriptions Prior to Visit  Medication Sig Dispense Refill  . acyclovir (ZOVIRAX) 400 MG  tablet   10  . amphetamine-dextroamphetamine (ADDERALL) 20 MG tablet Take one tablet po QAM, one tablet at 12 noon and one half tablet at 4pm 75 tablet 0  . buPROPion (WELLBUTRIN XL) 150 MG 24 hr tablet   5  . buPROPion (WELLBUTRIN XL) 300 MG 24 hr tablet Take 450 mg by mouth daily.     [name]. lamoTRIgine (LAMICTAL) 100 MG tablet Take 100 mg by mouth daily.    [name]. levonorgestrel (MIRENA) 20 MCG/24HR IUD 1 each by Intrauterine route once.    . Sodium Oxybate (XYREM) 500 MG/ML SOLN Take 9 mLs (4,500 mg total) by mouth daily. Take twice at night 4.5 gram , 4500 mg or 9 ml. . 540 mL 5  . venlafaxine XR (EFFEXOR-XR) 37.5 MG 24 hr capsule Take 37.5 mg by mouth daily. 4x day    . loratadine (CLARITIN) 10 MG tablet Take 10 mg by mouth daily.     No facility-administered medications prior to visit.    PAST MEDICAL HISTORY: Past Medical History  Diagnosis Date  . Recovering alcoholic   . Depression   . EDS (Ehlers-Danlos syndrome)     persistent excessive daytime sleepiness  . PTSD (post-traumatic stress disorder)   . Hypersomnia, persistent     MSLT no SREM but lass than 8 minutes SL.   [name]. Controlled narcolepsy 05/27/2013    PAST SURGICAL HISTORY: Past Surgical History  Procedure Laterality Date  . Lasik  Right 6/12    good success- vision correction    FAMILY HISTORY: Family History  Problem Relation Age of Onset  . Parkinson's disease Maternal Grandmother   . Drug abuse Maternal Grandfather   . Cancer Paternal Grandmother   . Cancer Paternal Grandfather   . Drug abuse Paternal Grandfather         PHYSICAL EXAM  Filed Vitals:   06/09/14 0916  BP: 105/67  Pulse: 96  Height:  (1.626 m)  Weight: 128 lb (58.06 kg)   Body mass index is 21.96 kg/(m^2).  Generalized: Well developed, in no acute distress   Neurological examination  Mentation: Alert oriented to time, place, history taking. Follows all commands speech and language fluent Cranial nerve II-XII: Pupils were equal  round reactive to light. Extraocular movements were full, visual field were full on confrontational test. Facial sensation and strength were normal. Uvula tongue midline. Head turning and shoulder shrug  were normal and symmetric. Motor: The motor testing reveals 5 over 5 strength of all 4 extremities. Good symmetric motor tone is noted throughout. Tremor noted in hands and legs bilaterally.  Sensory: Sensory testing is intact to soft touch on all 4 extremities. No evidence of extinction is noted.  Coordination: Cerebellar testing reveals good finger-nose-finger and heel-to-shin bilaterally.  Gait and station: Gait is normal. Tandem gait is slightly unsteady. Romberg is negative. No drift is seen.  Reflexes: Deep tendon reflexes are symmetric but hyperreflexic.     DIAGNOSTIC DATA (LABS, IMAGING, TESTING) - I reviewed patient records, labs, notes, testing and imaging myself where available.      Component Value Date/Time   NA 138 11/25/2013 1050   K 4.4 11/25/2013 1050   CL 100 11/25/2013 1050   CO2 28 11/25/2013 1050   GLUCOSE 86 11/25/2013 1050   BUN 15 11/25/2013 1050   CREATININE 1.06* 11/25/2013 1050   CALCIUM 9.4 11/25/2013 1050   PROT 7.2 11/25/2013 1050   AST 11 11/25/2013 1050   ALT 9 11/25/2013 1050   ALKPHOS 49 11/25/2013 1050   BILITOT 0.3 11/25/2013 1050   GFRNONAA 69 11/25/2013 1050   GFRAA 80 11/25/2013 1050      ASSESSMENT AND PLAN 34 y.o. year old female  has a past medical history of Recovering alcoholic; Depression; EDS (Ehlers-Danlos syndrome); PTSD (post-traumatic stress disorder); Hypersomnia, persistent; and Controlled narcolepsy (05/27/2013). here with:  1. Narcolepsy 2. Tremor d/t medication   Narcolepsy continues to be controlled with Xyrem and Adderall. I will check blood work today. She was recently started on Effexor and has developed a tremor. I have advised the patient to speak with her PCP regarding the side effects that she is experiencing with  Effexor. Her depression has been controlled with Effexor. She Denies any thoughts of harming herself or others. I explained that xyrem can exacerbate/cause symptoms of depression. If her symptoms worsen she should let us know, as xyrem may have to be discontinued. Patient verbalized understanding. She will follow-up in 6 months with Dr. Vickey Huger.   Butch Penny, MSN, NP-C 06/09/2014, 9:26 AM [facility] [address] [place], [place] [address] [phone]  Note: This document was prepared with digital dictation and possible smart phrase technology. Any transcriptional errors that result from this process are unintentional.

## 2014-06-10 LAB — COMPREHENSIVE METABOLIC PANEL
ALK PHOS: 53 IU/L (ref 39–117)
ALT: 17 IU/L (ref 0–32)
AST: 16 IU/L (ref 0–40)
Albumin/Globulin Ratio: 1.8 (ref 1.1–2.5)
Albumin: 4.4 g/dL (ref 3.5–5.5)
BILIRUBIN TOTAL: 0.3 mg/dL (ref 0.0–1.2)
BUN / CREAT RATIO: 12 (ref 8–20)
BUN: 11 mg/dL (ref 6–20)
CALCIUM: 9.3 mg/dL (ref 8.7–10.2)
CO2: 28 mmol/L (ref 18–29)
CREATININE: 0.94 mg/dL (ref 0.57–1.00)
Chloride: 100 mmol/L (ref 97–108)
GFR calc non Af Amer: 80 mL/min/{1.73_m2} (ref >59)
GFR, EST AFRICAN AMERICAN: 92 mL/min/{1.73_m2} (ref >59)
GLUCOSE: 84 mg/dL (ref 65–99)
Globulin, Total: 2.5 g/dL (ref 1.5–4.5)
POTASSIUM: 4 mmol/L (ref 3.5–5.2)
SODIUM: 141 mmol/L (ref 134–144)
TOTAL PROTEIN: 6.9 g/dL (ref 6.0–8.5)

## 2014-06-12 ENCOUNTER — Telehealth: Payer: Self-pay | Admitting: Neurology

## 2014-06-12 NOTE — Telephone Encounter (Signed)
[facility] from [facility] is calling stating that the medication Sodium Oxybate (XYREM) 500 MG/ML SOLN needs prior authorazation.  The number to call for verbal is [phone]. Please call and advise.

## 2014-06-12 NOTE — Telephone Encounter (Signed)
I called the number provided, answered clinical questions.  Request is currently under review.

## 2014-06-17 ENCOUNTER — Telehealth: Payer: Self-pay | Admitting: *Deleted

## 2014-06-17 NOTE — Telephone Encounter (Signed)
I called back.  They are requesting copies of old records.

## 2014-06-17 NOTE — Telephone Encounter (Signed)
Needing prior auth on Xyrem. There is additional information needed. Please call this number  [phone] to give the additional information.

## 2014-07-09 ENCOUNTER — Other Ambulatory Visit: Payer: Self-pay | Admitting: Neurology

## 2014-07-09 DIAGNOSIS — G4710 Hypersomnia, unspecified: Secondary | ICD-10-CM

## 2014-07-09 NOTE — Telephone Encounter (Signed)
Pt is calling requesting a written Rx for amphetamine-dextroamphetamine (ADDERALL) 20 MG tablet. Please call when ready for pick up. °

## 2014-07-10 MED ORDER — AMPHETAMINE-DEXTROAMPHETAMINE 20 MG PO TABS: ORAL_TABLET | ORAL | Status: DC

## 2014-07-13 ENCOUNTER — Telehealth: Payer: Self-pay

## 2014-07-13 NOTE — Telephone Encounter (Signed)
Called patient and informed Rx ready for pick up at front desk. Patient verbalized understanding.  

## 2014-07-22 ENCOUNTER — Encounter: Payer: Self-pay | Admitting: *Deleted

## 2014-08-05 ENCOUNTER — Ambulatory Visit: Payer: Self-pay | Admitting: Neurology

## 2014-08-05 NOTE — Telephone Encounter (Signed)
This encounter was created in error - please disregard.

## 2014-08-13 ENCOUNTER — Other Ambulatory Visit: Payer: Self-pay

## 2014-08-13 ENCOUNTER — Telehealth: Payer: Self-pay

## 2014-08-13 DIAGNOSIS — G4710 Hypersomnia, unspecified: Secondary | ICD-10-CM

## 2014-08-13 MED ORDER — AMPHETAMINE-DEXTROAMPHETAMINE 20 MG PO TABS: ORAL_TABLET | ORAL | Status: DC

## 2014-08-13 NOTE — Telephone Encounter (Signed)
Needs RX refill on Adderall 20mg  amount of75

## 2014-08-13 NOTE — Telephone Encounter (Signed)
Called patient and left her a message refill ready to be picked up relayed hours please bring photo ID.  Adderall.

## 2014-09-15 ENCOUNTER — Other Ambulatory Visit: Payer: Self-pay | Admitting: Neurology

## 2014-09-15 DIAGNOSIS — G4710 Hypersomnia, unspecified: Secondary | ICD-10-CM

## 2014-09-15 NOTE — Telephone Encounter (Signed)
Request entered, forwarded to provider for approval.  

## 2014-09-15 NOTE — Telephone Encounter (Signed)
Patient is calling for a written Rx Adderall 20 mg 75 pills.  Please call when ready.  Thanks!

## 2014-09-16 ENCOUNTER — Telehealth: Payer: Self-pay

## 2014-09-16 MED ORDER — AMPHETAMINE-DEXTROAMPHETAMINE 20 MG PO TABS: ORAL_TABLET | ORAL | Status: DC

## 2014-09-16 NOTE — Telephone Encounter (Signed)
Called pt and left a message on her cell that her RX for adderall is ready for pick up.

## 2014-10-15 ENCOUNTER — Other Ambulatory Visit: Payer: Self-pay | Admitting: Neurology

## 2014-10-15 DIAGNOSIS — G4710 Hypersomnia, unspecified: Secondary | ICD-10-CM

## 2014-10-15 DIAGNOSIS — G47419 Narcolepsy without cataplexy: Secondary | ICD-10-CM

## 2014-10-15 MED ORDER — SODIUM OXYBATE 500 MG/ML PO SOLN: 4500.0000 mg | Freq: Every day | ORAL | Status: DC

## 2014-10-15 NOTE — Telephone Encounter (Signed)
Patient called requesting a refill for Sodium Oxybate (XYREM) 500 MG/ML SOLN. Patient can be reached @ [phone]

## 2014-10-15 NOTE — Telephone Encounter (Signed)
Rx signed and faxed.

## 2014-10-15 NOTE — Telephone Encounter (Signed)
Request entered, forwarded to provider for approval.  

## 2014-10-19 ENCOUNTER — Other Ambulatory Visit: Payer: Self-pay | Admitting: Neurology

## 2014-10-19 ENCOUNTER — Telehealth: Payer: Self-pay | Admitting: Neurology

## 2014-10-19 DIAGNOSIS — G4710 Hypersomnia, unspecified: Secondary | ICD-10-CM

## 2014-10-19 MED ORDER — AMPHETAMINE-DEXTROAMPHETAMINE 20 MG PO TABS: ORAL_TABLET | ORAL | Status: DC

## 2014-10-19 NOTE — Telephone Encounter (Signed)
Patient is calling in regard to Xyrem you ordered on 6/9.  She states the pharmacy received a fax but that the Rx order itself did not come through.  Could you please refax.  Thanks!

## 2014-10-19 NOTE — Telephone Encounter (Signed)
Rx order re-faxed.  Xyrem [facility] confirmed receipt at 11:16 am.

## 2014-10-19 NOTE — Telephone Encounter (Signed)
Patient called and requested a refill on Rx. amphetamine-dextroamphetamine (ADDERALL) 20 MG tablet. Informed the patient it would be ready within 24 hours unless she hears otherwise from the nurse.

## 2014-10-19 NOTE — Telephone Encounter (Signed)
Request entered, forwarded to provider for approval.  

## 2014-10-20 ENCOUNTER — Telehealth: Payer: Self-pay

## 2014-10-20 NOTE — Telephone Encounter (Signed)
Called pt to inform her that her RX is available for pick up at the front desk. Left message on mobile per DPR.

## 2014-11-23 ENCOUNTER — Other Ambulatory Visit: Payer: Self-pay | Admitting: Neurology

## 2014-11-23 DIAGNOSIS — G4710 Hypersomnia, unspecified: Secondary | ICD-10-CM

## 2014-11-23 NOTE — Telephone Encounter (Signed)
Patient is calling for written Rx Adderall 20 mg. Thanks!

## 2014-11-23 NOTE — Telephone Encounter (Signed)
Request entered, forwarded to provider for approval.  

## 2014-11-24 ENCOUNTER — Telehealth: Payer: Self-pay

## 2014-11-24 MED ORDER — AMPHETAMINE-DEXTROAMPHETAMINE 20 MG PO TABS: ORAL_TABLET | ORAL | Status: DC

## 2014-11-24 NOTE — Telephone Encounter (Signed)
Left message on cell (per DPR) that pt's RX is ready for pick up at the front desk. 

## 2014-11-26 ENCOUNTER — Telehealth: Payer: Self-pay | Admitting: Neurology

## 2014-11-26 NOTE — Telephone Encounter (Signed)
Patient is calling regarding Rx Sodium Oxybate (XYREM) 500 MG/ML SOLN. The patient states [facility] states in order to get Sodium Oxybate (XYREM) 500 MG/ML SOLN the patient needs to be enrolled in a Xyrem program by the end of July. Please call and discuss. Thank you.

## 2014-11-26 NOTE — Telephone Encounter (Signed)
Records show the patient was already enrolled in Xyrem [facility].  I called [facility].  Spoke with [place].  She reviewed the patient file 4 times and said they do not need anything.  The patient has been enrolled for over 1 month.  Says nothing further is needed.  I called the patient back.  Got no answer.  Left message relaying info provided by [facility] ([facility]).  Asked that she call back if anything further is needed.

## 2014-12-08 ENCOUNTER — Ambulatory Visit (INDEPENDENT_AMBULATORY_CARE_PROVIDER_SITE_OTHER): Payer: BLUE CROSS/BLUE SHIELD | Admitting: Neurology

## 2014-12-08 ENCOUNTER — Encounter: Payer: Self-pay | Admitting: Neurology

## 2014-12-08 VITALS — BP 112/70 | HR 88 | Resp 20 | Ht 63.0 in | Wt 127.0 lb

## 2014-12-08 DIAGNOSIS — G47419 Narcolepsy without cataplexy: Secondary | ICD-10-CM | POA: Diagnosis not present

## 2014-12-08 DIAGNOSIS — Z5181 Encounter for therapeutic drug level monitoring: Secondary | ICD-10-CM

## 2014-12-08 NOTE — Progress Notes (Signed)
PATIENT: Sherry Rhodes DOB: [birth year]  REASON FOR VISIT: follow up- narcolepsy HISTORY FROM: patient  HISTORY OF PRESENT ILLNESS: Sherry Rhodes is a 34 year old female with a history of narcolepsy. She returns today for follow-up. She currently takes xyrem and it is working well. She does report that she has a hard time getting up in the morning. This is not a new finding. She states that prior to Xyrem she was not waking up easily and refreshed. She states that once she is up and moving she  feels awake.  At the last visit she reported some depression and had been started on Effexor. She reports that her depression has been under control with Effexor. However she has developed a significant tremor for the Effexor. Tremor is located in bilateral upper and lower extremities.  she also takes Adderall TID, she states that occasionally she will not take the 4:00 dose. Epworth is 8 was previosuly 10 and fatigue severity score is 49 was previously 46.   HISTORY 11/25/13: [name] is a 34 year old female with a history of narcolepsy. She returns today for follow-up. She currently takes xyrem and it is working well however she is having some depression. She states that she has no motivation to do things. Her PCP placed on her Effexor and she is going to see if that helps. Her Epworth score is 10 previously was 12 and fatigue severity score is 46 was previously 43. She goes to bed around 10pm and arises at 6am. Patient takes Adderall at 8am, 1200 and 4pm. She states that is currently working well for her. No new medical issues since last seen.    Interval history from 12-08-14 Sherry Rhodes carries a diagnosis of narcolepsy and has been treated with Xyrem. She still has a hard time getting up in the morning which is not new but she does feel awake once she overcomes his initial delay. Been Xyrem was started she begun to lose weight but she has gained some weight back after she was placed on an antidepressive  and first Effexor, which gave her tremors and no Cymbalta. The tremors are much lower in amplitude. She still has Adderall as needed and usually takes it 3 times a day. The dose has been stable for years now.  She continues to live alone with 2 dogs, her sister is expecting a baby( Llives in [place] ), she is very excited about this .   REVIEW OF SYSTEMS: Out of a complete 14 system review of symptoms, the patient complains only of the following symptoms, and all other reviewed systems are negative.  Patient on Xyrem was a fatigue severity score 44 points and an Epworth sleepiness score of 9 points. The Epworth sleepiness score is radically changed in comparison to the time prior to Xyrem initiation. The patient continues to feel alert and also states that her productivity and multitasking capacities have improved since her sleepiness has been successfully treated.  ALLERGIES: Allergies  Allergen Reactions  . Bee Venom     HOME MEDICATIONS: Outpatient Prescriptions Prior to Visit  Medication Sig Dispense Refill  . acyclovir (ZOVIRAX) 400 MG tablet   10  . amphetamine-dextroamphetamine (ADDERALL) 20 MG tablet Take one tablet po QAM, one tablet at 12 noon and one half tablet at 4pm 75 tablet 0  . buPROPion (WELLBUTRIN XL) 150 MG 24 hr tablet   5  . buPROPion (WELLBUTRIN XL) 300 MG 24 hr tablet Take 450 mg by mouth daily.     Sherry Rhodes  lamoTRIgine (LAMICTAL) 100 MG tablet Take 100 mg by mouth daily.    Sherry Rhodes levonorgestrel (MIRENA) 20 MCG/24HR IUD 1 each by Intrauterine route once.    . loratadine (CLARITIN) 10 MG tablet Take 10 mg by mouth daily.    . Sodium Oxybate (XYREM) 500 MG/ML SOLN Take 9 mLs (4,500 mg total) by mouth daily. Take twice at night 4.5 gram , 4500 mg or 9 ml. . 3 Bottle 5  . venlafaxine XR (EFFEXOR-XR) 37.5 MG 24 hr capsule Take 37.5 mg by mouth daily. 4x day     No facility-administered medications prior to visit.    PAST MEDICAL HISTORY: Past Medical History  Diagnosis Date    . Recovering alcoholic   . Depression   . EDS (Ehlers-Danlos syndrome)     persistent excessive daytime sleepiness  . PTSD (post-traumatic stress disorder)   . Hypersomnia, persistent     MSLT no SREM but lass than 8 minutes SL.   Sherry Rhodes Controlled narcolepsy 05/27/2013    PAST SURGICAL HISTORY: Past Surgical History  Procedure Laterality Date  . Lasik Right 6/12    good success- vision correction    FAMILY HISTORY: Family History  Problem Relation Age of Onset  . Parkinson's disease Maternal Grandmother   . Drug abuse Maternal Grandfather   . Cancer Paternal Grandmother   . Cancer Paternal Grandfather   . Drug abuse Paternal Grandfather         PHYSICAL EXAM  Filed Vitals:   12/08/14 1552  BP: 112/70  Pulse: 88  Resp: 20  Height:  (1.6 m)  Weight: 127 lb (57.607 kg)   Body mass index is 22.5 kg/(m^2).  Generalized: Well developed, in no acute distress .  Neck circumference is 13-1/2, the patient has mild micrognathia, there is no difficulty with nasal airflow, Mallampati is grade 2, no peripheral edema no visible rash or bruising.  Atraumatic normocephalic skull.   Neurological examination  Mentation: Alert oriented to time, place, history taking. Follows all commands speech and language fluent Cranial nerve  Pupils were equal round reactive to light. Extraocular movements were full, without nystagmus  visual field were full on confrontational test.  Facial sensation and strength were normal. Uvula tongue midline. Head turning and shoulder shrug  were normal and symmetric. Motor: The motor testing documents no resting tremor, the very fine amplitude action tremor, muscle bulk tone and muscle strength are symmetric. No evidence of cogwheeling no loss of grip strengths Sensory: Sensory testing is intact to soft touch on all 4 extremities. No evidence of extinction is noted.  Coordination: Cerebellar testing reveals good finger-nose-finger and heel-to-shin  bilaterally.  Gait and station: Gait is normal. Tandem gait is slightly unsteady. Romberg is negative.  No drift is seen.  Reflexes: Deep tendon reflexes are symmetric,  Not  hyperreflexic. No clonus.     DIAGNOSTIC DATA (LABS, IMAGING, TESTING) - I reviewed patient records, labs, notes, testing and imaging myself where available.  A comprehensive metabolic panel from February 2016 was entirely normal. Results were shared with the patient at the time.     Component Value Date/Time   NA 141 06/09/2014 1005   K 4.0 06/09/2014 1005   CL 100 06/09/2014 1005   CO2 28 06/09/2014 1005   GLUCOSE 84 06/09/2014 1005   BUN 11 06/09/2014 1005   CREATININE 0.94 06/09/2014 1005   CALCIUM 9.3 06/09/2014 1005   PROT 6.9 06/09/2014 1005   AST 16 06/09/2014 1005   ALT 17 06/09/2014 1005  ALKPHOS 53 06/09/2014 1005   BILITOT 0.3 06/09/2014 1005   GFRNONAA 80 06/09/2014 1005   GFRAA 92 06/09/2014 1005      ASSESSMENT AND PLAN 34 y.o. year old female  has a past medical history of Recovering alcoholic; Depression; EDS (Ehlers-Danlos syndrome); PTSD (post-traumatic stress disorder); Hypersomnia, persistent; and Controlled narcolepsy (05/27/2013). here with:  1. Narcolepsy 2. Tremor d/t medication - doing well, not impairing. Switched from Effexor to cymbalta for tremor control.   Narcolepsy continues to be controlled with Xyrem and Adderall. She Denies any thoughts of harming herself or others.  I explained that Xyrem can exacerbate/cause symptoms of depression.  If her symptoms worsen she should let us know, as xyrem may have to be discontinued. Patient verbalized understanding. She will follow-up in 6 months.  The patient needs comprehensive metabolic panels drawn every 6 months, and a revisit every 6 months with Epworth score to be obtained in each visit.  Melvyn Novas, MD .   12/08/2014, 4:05 PM [facility] [address], [address] [place], [place] [address] [phone]  Note: This document was prepared with digital dictation and possible smart phrase technology. Any transcriptional errors that result from this process are unintentional.

## 2014-12-09 ENCOUNTER — Telehealth: Payer: Self-pay

## 2014-12-09 LAB — COMPREHENSIVE METABOLIC PANEL
ALBUMIN: 4.3 g/dL (ref 3.5–5.5)
ALT: 33 IU/L — AB (ref 0–32)
AST: 15 IU/L (ref 0–40)
Albumin/Globulin Ratio: 2 (ref 1.1–2.5)
Alkaline Phosphatase: 50 IU/L (ref 39–117)
BUN/Creatinine Ratio: 9 (ref 8–20)
BUN: 12 mg/dL (ref 6–20)
CHLORIDE: 98 mmol/L (ref 97–108)
CO2: 24 mmol/L (ref 18–29)
CREATININE: 1.27 mg/dL — AB (ref 0.57–1.00)
Calcium: 9.2 mg/dL (ref 8.7–10.2)
GFR calc Af Amer: 64 mL/min/{1.73_m2} (ref >59)
GFR, EST NON AFRICAN AMERICAN: 55 mL/min/{1.73_m2} — AB (ref >59)
GLOBULIN, TOTAL: 2.2 g/dL (ref 1.5–4.5)
GLUCOSE: 94 mg/dL (ref 65–99)
POTASSIUM: 4.3 mmol/L (ref 3.5–5.2)
Sodium: 136 mmol/L (ref 134–144)
Total Protein: 6.5 g/dL (ref 6.0–8.5)

## 2014-12-09 NOTE — Telephone Encounter (Signed)
Called to discuss pt's lab results. Her creatinine is a little high, and Dr. Vickey Huger recommends a PCP follow up regarding that. I left a message on pt's cell phone (per DPR) telling that I have her lab results and to please call me back.

## 2014-12-14 NOTE — Telephone Encounter (Signed)
Spoke to pt and advised her that her creatinine is a little high and Dr. Vickey Huger advises her to follow up with her PCP. Pt wishes for me to fax these results to Dr. Abigail Miyamoto at [place], which I did.

## 2014-12-14 NOTE — Telephone Encounter (Signed)
Pt is returning your call

## 2014-12-16 ENCOUNTER — Telehealth: Payer: Self-pay | Admitting: Neurology

## 2014-12-16 NOTE — Telephone Encounter (Signed)
Spoke to patient about the again-( like 12 month ago)  elevated creatinine , she felt this was a result of dehydration, but her BUN does not fit that explanation. For this reason, I have forwarded her labs to Dr Abigail Miyamoto  To evaluate her for renal function. His nurse already contacted the patient yesterday and made an appointment. CD

## 2014-12-29 ENCOUNTER — Other Ambulatory Visit: Payer: Self-pay | Admitting: Neurology

## 2014-12-29 DIAGNOSIS — G4710 Hypersomnia, unspecified: Secondary | ICD-10-CM

## 2014-12-29 MED ORDER — AMPHETAMINE-DEXTROAMPHETAMINE 20 MG PO TABS: ORAL_TABLET | ORAL | Status: DC

## 2014-12-29 NOTE — Telephone Encounter (Signed)
Patient called requesting refill for amphetamine-dextroamphetamine (ADDERALL) 20 MG tablet . Patient advised RX will be ready within 24 hours unless otherwise informed by RN. °

## 2014-12-29 NOTE — Telephone Encounter (Signed)
Request entered, forwarded to provider for approval.  

## 2014-12-30 ENCOUNTER — Telehealth: Payer: Self-pay

## 2014-12-30 NOTE — Telephone Encounter (Signed)
Left a message on cell # (per DPR) that pt's RX is ready for pick up at the front desk.

## 2015-02-01 ENCOUNTER — Other Ambulatory Visit: Payer: Self-pay

## 2015-02-01 ENCOUNTER — Telehealth: Payer: Self-pay | Admitting: Neurology

## 2015-02-01 DIAGNOSIS — G4710 Hypersomnia, unspecified: Secondary | ICD-10-CM

## 2015-02-01 NOTE — Telephone Encounter (Signed)
Pt needs refill on amphetamine-dextroamphetamine (ADDERALL) 20 MG tablet. Thank you °

## 2015-02-02 MED ORDER — AMPHETAMINE-DEXTROAMPHETAMINE 20 MG PO TABS: ORAL_TABLET | ORAL | Status: DC

## 2015-02-02 NOTE — Telephone Encounter (Signed)
Left message on cell phone, per DPR, that her RX is ready for pick up at the front desk.

## 2015-03-10 ENCOUNTER — Telehealth: Payer: Self-pay

## 2015-03-10 ENCOUNTER — Other Ambulatory Visit: Payer: Self-pay | Admitting: Neurology

## 2015-03-10 DIAGNOSIS — G4710 Hypersomnia, unspecified: Secondary | ICD-10-CM

## 2015-03-10 MED ORDER — AMPHETAMINE-DEXTROAMPHETAMINE 20 MG PO TABS: ORAL_TABLET | ORAL | Status: DC

## 2015-03-10 NOTE — Telephone Encounter (Signed)
Left message on cell phone per DPR, pt's RX is ready for pick up at the front desk.

## 2015-03-10 NOTE — Telephone Encounter (Signed)
Pt needs refill on amphetamine-dextroamphetamine (ADDERALL) 20 MG tablet. Thank you °

## 2015-03-10 NOTE — Telephone Encounter (Signed)
Request entered, forwarded to provider for approval.  

## 2015-03-17 ENCOUNTER — Other Ambulatory Visit: Payer: Self-pay

## 2015-03-17 DIAGNOSIS — G4710 Hypersomnia, unspecified: Secondary | ICD-10-CM

## 2015-03-17 DIAGNOSIS — G47419 Narcolepsy without cataplexy: Secondary | ICD-10-CM

## 2015-03-17 MED ORDER — SODIUM OXYBATE 500 MG/ML PO SOLN: 4500.0000 mg | Freq: Every day | ORAL | Status: DC

## 2015-03-17 NOTE — Telephone Encounter (Signed)
Rx signed and faxed.

## 2015-04-14 ENCOUNTER — Telehealth: Payer: Self-pay

## 2015-04-14 ENCOUNTER — Other Ambulatory Visit: Payer: Self-pay | Admitting: Neurology

## 2015-04-14 DIAGNOSIS — G4710 Hypersomnia, unspecified: Secondary | ICD-10-CM

## 2015-04-14 MED ORDER — AMPHETAMINE-DEXTROAMPHETAMINE 20 MG PO TABS: ORAL_TABLET | ORAL | Status: DC

## 2015-04-14 NOTE — Telephone Encounter (Signed)
Called pt to tell her that her RX for adderall is ready for pick up at the front desk.  No answer, left a message asking that she call us back. If pt calls back, please advise her that her RX is ready for pick up.

## 2015-04-14 NOTE — Telephone Encounter (Signed)
Request entered, forwarded to provider for approval.  

## 2015-04-14 NOTE — Telephone Encounter (Signed)
Pt needs refill on amphetamine-dextroamphetamine (ADDERALL) 20 MG tablet. Thank you °

## 2015-04-20 ENCOUNTER — Telehealth: Payer: Self-pay | Admitting: Neurology

## 2015-04-20 NOTE — Telephone Encounter (Signed)
Pt called inquiring about status of prior auth for amphetamine-dextroamphetamine (ADDERALL) 20 MG tablet . Please call and advise

## 2015-04-21 NOTE — Telephone Encounter (Signed)
I have been out of the office.  The clinic indicates they did not get a PA request, and I have not gotten a PA request either.  I called the pharmacy.  Spoke with KP.  She kindly provided the Rx benefit info, [facility](U[employer]) ID # [id number].  Ins has been contacted and provided with clinical info.  Request is currently under review Ref # [id number]  [facility](UHC) has approved the request for coverage on Adderall effective until 04/20/2016 Ref # [id number]   I called the patient to advise.  Got no answer.  Left message.

## 2015-05-24 ENCOUNTER — Telehealth: Payer: Self-pay | Admitting: Neurology

## 2015-05-24 NOTE — Telephone Encounter (Signed)
[facility] [phone] called regarding [place]REM[place] denied, wants to know if our office plans to file an appeal. Phone number [facility] to start the appeal is [employer] [phone]. Call [facility] once appeal has been started.

## 2015-05-24 NOTE — Telephone Encounter (Signed)
Ins has been contacted and provided with additional info for appeal Ref # [id number].  Called Rocky Link[employer] back to advise.  Got no answer.  Left message.

## 2015-05-28 ENCOUNTER — Other Ambulatory Visit: Payer: Self-pay | Admitting: Neurology

## 2015-05-28 ENCOUNTER — Telehealth: Payer: Self-pay | Admitting: *Deleted

## 2015-05-28 DIAGNOSIS — G4710 Hypersomnia, unspecified: Secondary | ICD-10-CM

## 2015-05-28 MED ORDER — AMPHETAMINE-DEXTROAMPHETAMINE 20 MG PO TABS: ORAL_TABLET | ORAL | Status: DC

## 2015-05-28 NOTE — Telephone Encounter (Signed)
Patient is calling to get a written Rx for amphetamine-dextroamphetamine (ADDERALL) 20 MG tablet. I advised the Rx will be ready in 24 hours(Monday) unless otherwise advised by the nurse. Thank you.

## 2015-05-28 NOTE — Telephone Encounter (Signed)
Called pt and let her know adderall prescription ready for pick up.  She will pick up Monday.

## 2015-06-10 ENCOUNTER — Ambulatory Visit (INDEPENDENT_AMBULATORY_CARE_PROVIDER_SITE_OTHER): Payer: 59 | Admitting: Adult Health

## 2015-06-10 ENCOUNTER — Telehealth: Payer: Self-pay | Admitting: Neurology

## 2015-06-10 ENCOUNTER — Encounter: Payer: Self-pay | Admitting: Adult Health

## 2015-06-10 VITALS — BP 110/72 | HR 98 | Ht 63.0 in | Wt 125.0 lb

## 2015-06-10 DIAGNOSIS — G47419 Narcolepsy without cataplexy: Secondary | ICD-10-CM | POA: Diagnosis not present

## 2015-06-10 NOTE — Patient Instructions (Signed)
Continue Xyrem and adderall Shanda Bumps or myself will contact you about approval for xyrem If your symptoms worsen or you develop new symptoms please let us know.

## 2015-06-10 NOTE — Telephone Encounter (Signed)
Ins has been contacted.  Request is under review.  Decision should be made no later than 02/16 Ref # [id number]

## 2015-06-10 NOTE — Progress Notes (Signed)
PATIENT: Sherry Rhodes DOB: November 29, 1980  REASON FOR VISIT: follow up- narcolepsy HISTORY FROM: patient  HISTORY OF PRESENT ILLNESS: Sherry Rhodes is a 35 year old female with a history of narcolepsy. She returns today for follow-up. She continues to take Xyrem as well as Adderall. She states that the Xyrem continues to make her low sleepy in the morning however that usually resolves. She states that the Adderall is very helpful as she is in graduate school currently. She states that she will graduate and one semester and after that she will consider backing off on some of the medication. The patient denies any significant depression. Denies any thoughts of harming herself or others. She was switched from Effexor to Cymbalta. The patient is currently waiting for Xyrem to be approved by her insurance. She states that she may run out of this medication before it is approved. She returns today for an evaluation.  HISTORY  Sherry Rhodes is a 35 year old female with a history of narcolepsy. She returns today for follow-up. She currently takes xyrem and it is working well. She does report that she has a hard time getting up in the morning. This is not a new finding. She states that prior to Xyrem she was not waking up easily and refreshed. She states that once she is up and moving she feels awake. At the last visit she reported some depression and had been started on Effexor. She reports that her depression has been under control with Effexor. However she has developed a significant tremor for the Effexor. Tremor is located in bilateral upper and lower extremities. she also takes Adderall TID, she states that occasionally she will not take the 4:00 dose. Epworth is 8 was previosuly 10 and fatigue severity score is 49 was previously 46.   HISTORY 11/25/13: Sherry Rhodes is a 35 year old female with a history of narcolepsy. She returns today for follow-up. She currently takes xyrem and it is working well however  she is having some depression. She states that she has no motivation to do things. Her PCP placed on her Effexor and she is going to see if that helps. Her Epworth score is 10 previously was 12 and fatigue severity score is 46 was previously 43. She goes to bed around 10pm and arises at 6am. Patient takes Adderall at 8am, 1200 and 4pm. She states that is currently working well for her. No new medical issues since last seen.    REVIEW OF SYSTEMS: Out of a complete 14 system review of symptoms, the patient complains only of the following symptoms, and all other reviewed systems are negative.  See history of present illness, Epworth sleepiness score 8 fatigue severity score 36  ALLERGIES: Allergies  Allergen Reactions  . Bee Venom     HOME MEDICATIONS: Outpatient Prescriptions Prior to Visit  Medication Sig Dispense Refill  . acyclovir (ZOVIRAX) 400 MG tablet   10  . amphetamine-dextroamphetamine (ADDERALL) 20 MG tablet Take one tablet po QAM, one tablet at 12 noon and one half tablet at 4pm 75 tablet 0  . buPROPion (WELLBUTRIN XL) 150 MG 24 hr tablet 450 mg.   5  . DULoxetine (CYMBALTA) 30 MG capsule TK ONE C PO  ONCE DAILY  2  . EPIDUO 0.1-2.5 % gel   11  . lamoTRIgine (LAMICTAL) 100 MG tablet Take 100 mg by mouth daily.    [name] levonorgestrel (MIRENA) 20 MCG/24HR IUD 1 each by Intrauterine route once.    . loratadine (  CLARITIN) 10 MG tablet Take 10 mg by mouth daily.    . Sodium Oxybate (XYREM) 500 MG/ML SOLN Take 9 mLs (4,500 mg total) by mouth daily. Take twice at night 4.5 gram , 4500 mg or 9 ml. . 3 Bottle 5  . buPROPion (WELLBUTRIN XL) 300 MG 24 hr tablet Take 450 mg by mouth daily.     [name] venlafaxine XR (EFFEXOR-XR) 37.5 MG 24 hr capsule Take 37.5 mg by mouth daily. 4x day     No facility-administered medications prior to visit.    PAST MEDICAL HISTORY: Past Medical History  Diagnosis Date  . Recovering alcoholic (HCC)   . Depression   . EDS (Ehlers-Danlos syndrome)      persistent excessive daytime sleepiness  . PTSD (post-traumatic stress disorder)   . Hypersomnia, persistent     MSLT no SREM but lass than 8 minutes SL.   [name] Controlled narcolepsy 05/27/2013    PAST SURGICAL HISTORY: Past Surgical History  Procedure Laterality Date  . Lasik Right 6/12    good success- vision correction    FAMILY HISTORY: Family History  Problem Relation Age of Onset  . Parkinson's disease Maternal Grandmother   . Drug abuse Maternal Grandfather   . Cancer Paternal Grandmother   . Cancer Paternal Grandfather   . Drug abuse Paternal Grandfather     SOCIAL HISTORY: Social History   Social History  . Marital Status: Single    Spouse Name: N/A  . Number of Children: 0  . Years of Education: Bachelors   Occupational History  .  [employer]   Social History Main Topics  . Smoking status: Former Games developer  . Smokeless tobacco: Never Used     Comment: quit smoking 3/10  . Alcohol Use: No     Comment: quit alcohol 02/2003-recovering alcohol abuser  . Drug Use: No  . Sexual Activity: Not on file   Other Topics Concern  . Not on file   Social History Narrative       Patient is single, recovering ETOH abuser. Patient has her bachelors degree  Patient is working on her masters at [employer].. 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    Patient is right-handed.   Patient does not drink any caffeine.                  PHYSICAL EXAM  Filed Vitals:   06/10/15 0956  BP: 110/72  Pulse: 98  Height: 5\' 3"  (1.6 m)  Weight: 125 lb (56.7 kg)   Body mass index is 22.15 kg/(m^2).  Generalized: Well developed, in no acute distress   Neurological examination  Mentation: Alert oriented to time, place, history taking. Follows all commands speech and language fluent Cranial nerve II-XII: Pupils were equal round reactive to light. Extraocular movements were full, visual field were full on confrontational test. Facial sensation and strength were normal. Uvula tongue  midline. Head turning and shoulder shrug  were normal and symmetric. Motor: The motor testing reveals 5 over 5 strength of all 4 extremities. Good symmetric motor tone is noted throughout.  Sensory: Sensory testing is intact to soft touch on all 4 extremities. No evidence of extinction is noted.  Coordination: Cerebellar testing reveals good finger-nose-finger and heel-to-shin bilaterally.  Gait and station: Gait is normal. Tandem gait is normal. Romberg is negative. No drift is seen.  Reflexes: Deep tendon reflexes are symmetric and normal bilaterally.   DIAGNOSTIC DATA (LABS, IMAGING, TESTING) - I reviewed patient records, labs, notes, testing and  imaging myself where available.  No results found for: WBC, HGB, HCT, MCV, PLT    Component Value Date/Time   NA 136 12/08/2014 1632   K 4.3 12/08/2014 1632   CL 98 12/08/2014 1632   CO2 24 12/08/2014 1632   GLUCOSE 94 12/08/2014 1632   BUN 12 12/08/2014 1632   CREATININE 1.27* 12/08/2014 1632   CALCIUM 9.2 12/08/2014 1632   PROT 6.5 12/08/2014 1632   ALBUMIN 4.3 12/08/2014 1632   AST 15 12/08/2014 1632   ALT 33* 12/08/2014 1632   ALKPHOS 50 12/08/2014 1632   BILITOT <0.2 12/08/2014 1632   BILITOT 0.3 06/09/2014 1005   GFRNONAA 55* 12/08/2014 1632   GFRAA 64 12/08/2014 1632      ASSESSMENT AND PLAN 35 y.o. year old female  has a past medical history of Recovering alcoholic (HCC); Depression; EDS (Ehlers-Danlos syndrome); PTSD (post-traumatic stress disorder); Hypersomnia, persistent; and Controlled narcolepsy (05/27/2013). here with:  1. Narcolepsy  The patient is currently doing well. She will continue on Xyrem as well as Adderall. The patient recently had blood work with her primary care she will have this faxed to our office. Advised the patient that we will call her once we have information about approval for Xyrem. She verbalized understanding. She will follow-up in 6 months or sooner if needed.     Butch Penny, MSN,  NP-C 06/10/2015, 10:32 AM [facility] [address] [place], [place] [address] [phone]

## 2015-06-10 NOTE — Progress Notes (Signed)
I agree with the assessment and plan as directed by NP .The patient is known to me .   Nayeliz Hipp, MD  

## 2015-06-10 NOTE — Telephone Encounter (Signed)
Called ins to folow up.  Spoke with [place].  She said the request is still under review, but a decision should be made no later than 02/16, as the request is set to close at that time.

## 2015-06-10 NOTE — Telephone Encounter (Signed)
[facility] [phone] called regarding XYREM, denied. Call [facility] [phone] to start appeal.

## 2015-06-18 NOTE — Telephone Encounter (Signed)
I called ins to follow up and see if there were any updates.  Spoke with [employer].  She verified they have received all requested info, however, the request is still under review at this time.

## 2015-06-22 NOTE — Telephone Encounter (Signed)
I called ins again to follow up.  Spoke with Sherry Rhodes.  She kindly looked into the case and sees that the denial was overturned effective until 09/12/2015 Ref # [id number].  She processed a test claim, and verified the Rx does go through ins without any issues.  I called the patient to advise.  Got no answer.  Left message.   I called Xyrem to relay approval info to them as well.  Spoke with Sherry Rhodes.  He updated this info in they're system, and will forward a message to [place].  They will call us back if anything further is needed.

## 2015-07-05 ENCOUNTER — Other Ambulatory Visit: Payer: Self-pay | Admitting: Neurology

## 2015-07-05 DIAGNOSIS — G4710 Hypersomnia, unspecified: Secondary | ICD-10-CM

## 2015-07-05 MED ORDER — AMPHETAMINE-DEXTROAMPHETAMINE 20 MG PO TABS: ORAL_TABLET | ORAL | Status: DC

## 2015-07-05 NOTE — Telephone Encounter (Signed)
Patient called to request refill of amphetamine-dextroamphetamine (ADDERALL) 20 MG tablet °

## 2015-07-05 NOTE — Telephone Encounter (Signed)
I left a message on pt's cell number per DPR, that her RX for adderall is ready for pick up at the front desk.

## 2015-08-05 ENCOUNTER — Other Ambulatory Visit: Payer: Self-pay | Admitting: Neurology

## 2015-08-05 DIAGNOSIS — G4710 Hypersomnia, unspecified: Secondary | ICD-10-CM

## 2015-08-05 MED ORDER — AMPHETAMINE-DEXTROAMPHETAMINE 20 MG PO TABS: ORAL_TABLET | ORAL | Status: DC

## 2015-08-05 NOTE — Telephone Encounter (Signed)
Patient called to request refill of amphetamine-dextroamphetamine (ADDERALL) 20 MG tablet °

## 2015-08-05 NOTE — Telephone Encounter (Signed)
Left a message on pt's cell phone per DPR that pt's RX is ready for pick up at the front desk.

## 2015-08-17 ENCOUNTER — Other Ambulatory Visit: Payer: Self-pay

## 2015-08-17 DIAGNOSIS — G4710 Hypersomnia, unspecified: Secondary | ICD-10-CM

## 2015-08-17 DIAGNOSIS — G47419 Narcolepsy without cataplexy: Secondary | ICD-10-CM

## 2015-08-17 MED ORDER — SODIUM OXYBATE 500 MG/ML PO SOLN: 4500.0000 mg | Freq: Every day | ORAL | Status: DC

## 2015-08-17 NOTE — Telephone Encounter (Signed)
RX faxed to xyrem REMS program. Received a receipt of confirmation.  

## 2015-08-23 ENCOUNTER — Telehealth: Payer: Self-pay

## 2015-08-23 NOTE — Telephone Encounter (Signed)
Pt's xyrem pa has been approved beginning 08/24/15 until coverage for the medication is no longer available under the benefit plan or the medication becomes subject to a pharmacy benefit coverage requirement, such as supply limits or notification, whichever occurs first.

## 2015-09-14 ENCOUNTER — Other Ambulatory Visit: Payer: Self-pay | Admitting: Neurology

## 2015-09-14 DIAGNOSIS — G4710 Hypersomnia, unspecified: Secondary | ICD-10-CM

## 2015-09-14 MED ORDER — AMPHETAMINE-DEXTROAMPHETAMINE 20 MG PO TABS: ORAL_TABLET | ORAL | Status: DC

## 2015-09-14 NOTE — Telephone Encounter (Signed)
Patient requesting refill of amphetamine-dextroamphetamine (ADDERALL) 20 MG tablet Pharmacy:[facility] [id number] - [place], [place] - [address] [facility] [address] [facility]

## 2015-09-14 NOTE — Telephone Encounter (Signed)
Called pt to inform her that her adderall RX is available for pick up at the front desk. Left a detailed message on her cell number per DPR.

## 2015-10-20 ENCOUNTER — Other Ambulatory Visit: Payer: Self-pay | Admitting: Neurology

## 2015-10-20 DIAGNOSIS — G4710 Hypersomnia, unspecified: Secondary | ICD-10-CM

## 2015-10-20 MED ORDER — AMPHETAMINE-DEXTROAMPHETAMINE 20 MG PO TABS: ORAL_TABLET | ORAL | Status: DC

## 2015-10-20 NOTE — Telephone Encounter (Signed)
Patient requesting refill of amphetamine-dextroamphetamine (ADDERALL) 20 MG tablet. ° ° °

## 2015-10-20 NOTE — Telephone Encounter (Signed)
I left a message on cell phone (per DPR) that pt's RX is ready for pick up at the front desk.

## 2015-11-24 ENCOUNTER — Other Ambulatory Visit: Payer: Self-pay | Admitting: Neurology

## 2015-11-24 DIAGNOSIS — G4710 Hypersomnia, unspecified: Secondary | ICD-10-CM

## 2015-11-24 NOTE — Telephone Encounter (Signed)
Patient requesting refill of amphetamine-dextroamphetamine (ADDERALL) 20 MG tablet. ° ° °

## 2015-11-25 MED ORDER — AMPHETAMINE-DEXTROAMPHETAMINE 20 MG PO TABS: ORAL_TABLET | ORAL | Status: DC

## 2015-11-25 NOTE — Telephone Encounter (Signed)
Pt's RX for adderall is ready for pick up at the front desk. I called and left a detailed message on cell phone (per DPR) advising her of this information and asking her to call me back with further questions.

## 2015-12-08 ENCOUNTER — Ambulatory Visit (INDEPENDENT_AMBULATORY_CARE_PROVIDER_SITE_OTHER): Payer: 59 | Admitting: Adult Health

## 2015-12-08 ENCOUNTER — Encounter: Payer: Self-pay | Admitting: Adult Health

## 2015-12-08 VITALS — BP 111/69 | HR 94 | Ht 63.0 in | Wt 132.6 lb

## 2015-12-08 DIAGNOSIS — G47419 Narcolepsy without cataplexy: Secondary | ICD-10-CM

## 2015-12-08 NOTE — Patient Instructions (Signed)
Continue xyrem and adderall If depression worsens xyrem may need to discontinued If your symptoms worsen or you develop new symptoms please let us know.

## 2015-12-08 NOTE — Progress Notes (Signed)
PATIENT: Sherry Rhodes DOB: 22-Aug-1980  REASON FOR VISIT: follow up- narcolepsy HISTORY FROM: patient  HISTORY OF PRESENT ILLNESS: Ms. Sherry Rhodes is a 35 year old female with a history of narcolepsy. She returns today for follow-up. She continues on Xyrem and Adderall. She reports that this is working well for her. She states that her depression is under relatively good control. However she states that she would like more desire to do things now that she's done with graduate school. Her primary care has recently increased Cymbalta to 40 mg. She denies any suicidal thoughts or thoughts of harming others. Her primary care recently drew lab work. She returns today for follow-up.  HISTORY 06/10/15: Ms. Sherry Rhodes is a 35 year old female with a history of narcolepsy. She returns today for follow-up. She continues to take Xyrem as well as Adderall. She states that the Xyrem continues to make her low sleepy in the morning however that usually resolves. She states that the Adderall is very helpful as she is in graduate school currently. She states that she will graduate and one semester and after that she will consider backing off on some of the medication. The patient denies any significant depression. Denies any thoughts of harming herself or others. She was switched from Effexor to Cymbalta. The patient is currently waiting for Xyrem to be approved by her insurance. She states that she may run out of this medication before it is approved. She returns today for an evaluation.  HISTORY  Ms. Sherry Rhodes is a 35 year old female with a history of narcolepsy. She returns today for follow-up. She currently takes xyrem and it is working well. She does report that she has a hard time getting up in the morning. This is not a new finding. She states that prior to Xyrem she was not waking up easily and refreshed. She states that once she is up and moving she feels awake. At the last visit she reported some depression and  had been started on Effexor. She reports that her depression has been under control with Effexor. However she has developed a significant tremor for the Effexor. Tremor is located in bilateral upper and lower extremities. she also takes Adderall TID, she states that occasionally she will not take the 4:00 dose. Epworth is 8 was previosuly 10 and fatigue severity score is 49 was previously 46.   HISTORY 11/25/13: Ms. Sherry Rhodes is a 35 year old female with a history of narcolepsy. She returns today for follow-up. She currently takes xyrem and it is working well however she is having some depression. She states that she has no motivation to do things. Her PCP placed on her Effexor and she is going to see if that helps. Her Epworth score is 10 previously was 12 and fatigue severity score is 46 was previously 43. She goes to bed around 10pm and arises at 6am. Patient takes Adderall at 8am, 1200 and 4pm. She states that is currently working well for her. No new medical issues since last seen.    REVIEW OF SYSTEMS: Out of a complete 14 system review of symptoms, the patient complains only of the following symptoms, and all other reviewed systems are negative.  See history of present illness  ALLERGIES: Allergies  Allergen Reactions  . Bee Venom     HOME MEDICATIONS: Outpatient Medications Prior to Visit  Medication Sig Dispense Refill  . acyclovir (ZOVIRAX) 400 MG tablet   10  . amphetamine-dextroamphetamine (ADDERALL) 20 MG tablet Take one tablet po QAM,  one tablet at 12 noon and one half tablet at 4pm 75 tablet 0  . buPROPion (WELLBUTRIN XL) 150 MG 24 hr tablet 450 mg.   5  . EPIDUO 0.1-2.5 % gel   11  . lamoTRIgine (LAMICTAL) 100 MG tablet Take 100 mg by mouth daily.    [name] levonorgestrel (MIRENA) 20 MCG/24HR IUD 1 each by Intrauterine route once.    . loratadine (CLARITIN) 10 MG tablet Take 10 mg by mouth daily.    . Sodium Oxybate (XYREM) 500 MG/ML SOLN Take 9 mLs (4,500 mg total) by mouth  daily. Take twice at night 4.5 gram , 4500 mg or 9 ml. . 3 Bottle 5  . DULoxetine (CYMBALTA) 30 MG capsule TK ONE C PO  ONCE DAILY  2   No facility-administered medications prior to visit.     PAST MEDICAL HISTORY: Past Medical History:  Diagnosis Date  . Controlled narcolepsy 05/27/2013  . Depression   . EDS (Ehlers-Danlos syndrome)    persistent excessive daytime sleepiness  . Hypersomnia, persistent    MSLT no SREM but lass than 8 minutes SL.   [name] PTSD (post-traumatic stress disorder)   . Recovering alcoholic (HCC)     PAST SURGICAL HISTORY: Past Surgical History:  Procedure Laterality Date  . LASIK Right 6/12   good success- vision correction    FAMILY HISTORY: Family History  Problem Relation Age of Onset  . Parkinson's disease Maternal Grandmother   . Drug abuse Maternal Grandfather   . Cancer Paternal Grandmother   . Cancer Paternal Grandfather   . Drug abuse Paternal Grandfather     SOCIAL HISTORY: Social History   Social History  . Marital status: Single    Spouse name: N/A  . Number of children: 0  . Years of education: Bachelors   Occupational History  .  [employer]   Social History Main Topics  . Smoking status: Former Games developer  . Smokeless tobacco: Never Used     Comment: quit smoking 3/10  . Alcohol use No     Comment: quit alcohol 02/2003-recovering alcohol abuser  . Drug use: No  . Sexual activity: Not on file   Other Topics Concern  . Not on file   Social History Narrative       Patient is single, recovering ETOH abuser. Patient has her bachelors degree  Patient is working on her masters at [employer].. 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    Patient is right-handed.   Patient does not drink any caffeine.                  PHYSICAL EXAM  Vitals:   12/08/15 1039  BP: 111/69  Pulse: 94  Weight: 132 lb 9.6 oz (60.1 kg)  Height:  (1.6 m)   Body mass index is 23.49 kg/m.  Generalized: Well developed, in no acute distress    Neurological examination  Mentation: Alert oriented to time, place, history taking. Follows all commands speech and language fluent Cranial nerve II-XII: Pupils were equal round reactive to light. Extraocular movements were full, visual field were full on confrontational test. Facial sensation and strength were normal. Uvula tongue midline. Head turning and shoulder shrug  were normal and symmetric. Motor: The motor testing reveals 5 over 5 strength of all 4 extremities. Good symmetric motor tone is noted throughout.  Sensory: Sensory testing is intact to soft touch on all 4 extremities. No evidence of extinction is noted.  Coordination: Cerebellar testing reveals good finger-nose-finger  and heel-to-shin bilaterally.  Gait and station: Gait is normal. Tandem gait is normal. Romberg is negative. No drift is seen.  Reflexes: Deep tendon reflexes are symmetric and normal bilaterally.   DIAGNOSTIC DATA (LABS, IMAGING, TESTING) - I reviewed patient records, labs, notes, testing and imaging myself where available.       ASSESSMENT AND PLAN 35 y.o. year old female  has a past medical history of Controlled narcolepsy (05/27/2013); Depression; EDS (Ehlers-Danlos syndrome); Hypersomnia, persistent; PTSD (post-traumatic stress disorder); and Recovering alcoholic (HCC). here with:  1. Narcolepsy  Overall the patient is doing well. She'll continue on Xyrem and Adderall. She recently had blood work with her primary care provider. She will have those results faxed to [place]. Patient advised that if her depression worsens she will need to let us know. Xyrem may have to be discontinued. Patient voices understanding. Follow-up up in 6 months with Dr. Vergia Alcon, MSN, NP-C 12/08/2015, 11:04 AM [facility] [address], [address] [place], [place] [address] [phone]

## 2015-12-09 NOTE — Progress Notes (Signed)
I agree with the assessment and plan as directed by NP .The patient is known to me .   Cinque Begley, MD  

## 2015-12-28 ENCOUNTER — Other Ambulatory Visit: Payer: Self-pay | Admitting: Neurology

## 2015-12-28 DIAGNOSIS — G4710 Hypersomnia, unspecified: Secondary | ICD-10-CM

## 2015-12-28 MED ORDER — AMPHETAMINE-DEXTROAMPHETAMINE 20 MG PO TABS: ORAL_TABLET | ORAL | 0 refills | Status: DC

## 2015-12-28 NOTE — Telephone Encounter (Signed)
I left a detailed message, per DPR, advising her that her RX for adderall is ready for pick up at the front desk and to call me back with any further questions.

## 2015-12-28 NOTE — Addendum Note (Signed)
Addended by: Geronimo RunningINKINS, Aylee Littrell A on: 12/28/2015 11:30 AM   Modules accepted: Orders

## 2015-12-28 NOTE — Telephone Encounter (Signed)
Patient requesting refill of  amphetamine-dextroamphetamine (ADDERALL) 20 MG tablet. I advised the Rx will be ready in 24 hours unless the nurse advises otherwise. ° °

## 2016-02-08 ENCOUNTER — Other Ambulatory Visit: Payer: Self-pay | Admitting: Neurology

## 2016-02-08 DIAGNOSIS — G4710 Hypersomnia, unspecified: Secondary | ICD-10-CM

## 2016-02-08 MED ORDER — AMPHETAMINE-DEXTROAMPHETAMINE 20 MG PO TABS: ORAL_TABLET | ORAL | 0 refills | Status: DC

## 2016-02-08 NOTE — Telephone Encounter (Signed)
I left a detailed message on pt's cell phone (per DPR) advising her that her RX for adderall is ready for pick up at the front desk and to call back if she has further questions.

## 2016-02-08 NOTE — Telephone Encounter (Signed)
Patient requesting refill of amphetamine-dextroamphetamine (ADDERALL) 20 MG tablet °Pharmacy: pick up ° °

## 2016-03-13 ENCOUNTER — Other Ambulatory Visit: Payer: Self-pay | Admitting: Neurology

## 2016-03-13 DIAGNOSIS — G4710 Hypersomnia, unspecified: Secondary | ICD-10-CM

## 2016-03-13 MED ORDER — AMPHETAMINE-DEXTROAMPHETAMINE 20 MG PO TABS: ORAL_TABLET | ORAL | 0 refills | Status: DC

## 2016-03-13 NOTE — Telephone Encounter (Signed)
Patient called to request refill of amphetamine-dextroamphetamine (ADDERALL) 20 MG tablet °

## 2016-03-14 NOTE — Telephone Encounter (Signed)
I left a detailed message on pt's cell # (per DPR) advising pt that her RX for adderall is ready for pick up at the front desk and asked that she call us back with any further questions or concerns.

## 2016-04-17 ENCOUNTER — Telehealth: Payer: Self-pay | Admitting: Neurology

## 2016-04-17 DIAGNOSIS — G4710 Hypersomnia, unspecified: Secondary | ICD-10-CM

## 2016-04-17 MED ORDER — AMPHETAMINE-DEXTROAMPHETAMINE 20 MG PO TABS: ORAL_TABLET | ORAL | 0 refills | Status: DC

## 2016-04-17 NOTE — Addendum Note (Signed)
Addended by: Crisoforo OxfordURNER, Rashaun Wichert S on: 04/17/2016 12:09 PM   Modules accepted: Orders

## 2016-04-17 NOTE — Telephone Encounter (Signed)
Rx printed, Rx due, up to date on appts. Waiting for signature then will put at front desk

## 2016-04-17 NOTE — Telephone Encounter (Signed)
Patient requesting refill of  amphetamine-dextroamphetamine (ADDERALL) 20 MG tablet. I advised the Rx will be ready in 24 hours unless the nurse advises otherwise. ° °

## 2016-05-23 ENCOUNTER — Other Ambulatory Visit: Payer: Self-pay | Admitting: Adult Health

## 2016-05-23 DIAGNOSIS — G4710 Hypersomnia, unspecified: Secondary | ICD-10-CM

## 2016-05-23 MED ORDER — AMPHETAMINE-DEXTROAMPHETAMINE 20 MG PO TABS: ORAL_TABLET | ORAL | 0 refills | Status: DC

## 2016-05-23 NOTE — Telephone Encounter (Signed)
Pt request refill for amphetamine-dextroamphetamine (ADDERALL) 20 MG tablet the patient is aware the office could be closed tomorrow due to inclement weather

## 2016-05-23 NOTE — Telephone Encounter (Signed)
I called pt and left a detailed message on her cell phone number per DPR advising her that her RX for adderall is available for pick up at the front desk and that our office is closed at least through noon tomorrow due to the weather. Pt should call before coming to pick up RX to ensure office is open. I asked pt to call back with questions/concerns.

## 2016-06-12 ENCOUNTER — Encounter: Payer: Self-pay | Admitting: Neurology

## 2016-06-12 ENCOUNTER — Ambulatory Visit (INDEPENDENT_AMBULATORY_CARE_PROVIDER_SITE_OTHER): Payer: 59 | Admitting: Neurology

## 2016-06-12 DIAGNOSIS — G47411 Narcolepsy with cataplexy: Secondary | ICD-10-CM | POA: Insufficient documentation

## 2016-06-12 DIAGNOSIS — G4710 Hypersomnia, unspecified: Secondary | ICD-10-CM

## 2016-06-12 DIAGNOSIS — G47419 Narcolepsy without cataplexy: Secondary | ICD-10-CM | POA: Diagnosis not present

## 2016-06-12 MED ORDER — SODIUM OXYBATE 500 MG/ML PO SOLN: 4500.0000 mg | Freq: Every day | ORAL | 5 refills | Status: DC

## 2016-06-12 MED ORDER — AMPHETAMINE-DEXTROAMPHETAMINE 20 MG PO TABS: ORAL_TABLET | ORAL | 0 refills | Status: DC

## 2016-06-12 NOTE — Progress Notes (Signed)
PATIENT: Sherry Rhodes DOB: [birth year]  REASON FOR VISIT: follow up- narcolepsy HISTORY FROM: patient  HISTORY OF PRESENT ILLNESS:   HISTORY CD  Sherry Rhodes is a 36 year old female with a history of narcolepsy. She returns today for follow-up. She currently takes xyrem and it is working well. She does report that she has a hard time getting up in the morning. This is not a new finding. She states that prior to Xyrem she was not waking up easily and refreshed. She states that once she is up and moving she feels awake. At the last visit she reported some depression and had been started on Effexor. She reports that her depression has been under control with Effexor. However she has developed a significant tremor for the Effexor. Tremor is located in bilateral upper and lower extremities.She also takes Adderall TID, she states that occasionally she will not take the 4:00 dose.   HISTORY 11/25/13: Sherry Rhodes is a 36 year old female with a history of narcolepsy. She returns today for follow-up. She currently takes xyrem and it is working well however she is having some depression. She states that she has no motivation to do things. Her PCP placed on her Effexor and she is going to see if that helps. Her Epworth score is 10 previously was 12 and fatigue severity score is 46 was previously 43. She goes to bed around 10pm and arises at 6am. Patient takes Adderall at 8am, 1200 and 4pm. She states that is currently working well for her. No new medical issues since last seen.   HISTORY 06/10/15: Sherry Rhodes is a 36 year old female with a history of narcolepsy. She returns today for follow-up. She continues to take Xyrem as well as Adderall. She states that the Xyrem continues to make her low sleepy in the morning however that usually resolves. She states that the Adderall is very helpful as she is in graduate school currently. She states that she will graduate and one semester and after that she will  consider backing off on some of the medication. The patient denies any significant depression. Denies any thoughts of harming herself or others. She was switched from Effexor to Cymbalta. The patient is currently waiting for Xyrem to be approved by her insurance. She states that she may run out of this medication before it is approved. She returns today for an evaluation.  Interval history from June 12, 2016. CD I have pleasure of seeing Sherry Rhodes again today, who has recently graduated and has begun working from home. Her narcolepsy related excessive daytime sleepiness remains well controlled on Xyrem with additional use of Adderall. She states that she is not feeling easily distracted, that she is not dosing or daydreaming while working from home. She lives alone but shares her household with 2 dogs.  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 She is taking Cymbalta for depression which was exchanged for Effexor. Weaning off Effexor she suffered temple rarely extreme tremors and dysesthesias. The little electric shock sensations were very unpleasant. Sleep habits : bedtime is now 9 PM , sleeping from 9.30 to 6 AM, no need for alarm , she needs neither to set an alarm for her second dose of Xyrem nor for her morning rise time. This has been entrained rhythm.   REVIEW OF SYSTEMS: Out of a complete 14 system review of symptoms, the patient complains only of the following symptoms, and all other reviewed systems are negative.  There is no evidence of clinical depression, she feels well, sleeps well and is well rested during the day her fatigue score is endorsed at 41 points, the Epworth sleepiness score was endorsed at 7 points with 2 points for lying down to rest in the afternoon, 0 points for conversation or driving a car in traffic. The rest endorsed at one point. No tremors, no bradykinesia.   ALLERGIES: Allergies  Allergen Reactions  . Bee Venom     HOME  MEDICATIONS: Outpatient Medications Prior to Visit  Medication Sig Dispense Refill  . acyclovir (ZOVIRAX) 400 MG tablet   10  . amphetamine-dextroamphetamine (ADDERALL) 20 MG tablet Take one tablet po QAM, one tablet at 12 noon and one half tablet at 4pm 75 tablet 0  . buPROPion (WELLBUTRIN XL) 150 MG 24 hr tablet 450 mg.   5  . DULoxetine HCl 40 MG CPEP 40 mg.     . EPIDUO 0.1-2.5 % gel   11  . lamoTRIgine (LAMICTAL) 100 MG tablet Take 100 mg by mouth daily.    [name] levonorgestrel (MIRENA) 20 MCG/24HR IUD 1 each by Intrauterine route once.    . loratadine (CLARITIN) 10 MG tablet Take 10 mg by mouth daily.    . Sodium Oxybate (XYREM) 500 MG/ML SOLN Take 9 mLs (4,500 mg total) by mouth daily. Take twice at night 4.5 gram , 4500 mg or 9 ml. . 3 Bottle 5   No facility-administered medications prior to visit.     PAST MEDICAL HISTORY: Past Medical History:  Diagnosis Date  . Controlled narcolepsy 05/27/2013  . Depression   . EDS (Ehlers-Danlos syndrome)    persistent excessive daytime sleepiness  . Hypersomnia, persistent    MSLT no SREM but lass than 8 minutes SL.   [name] PTSD (post-traumatic stress disorder)   . Recovering alcoholic (HCC)     PAST SURGICAL HISTORY: Past Surgical History:  Procedure Laterality Date  . LASIK Right 6/12   good success- vision correction    FAMILY HISTORY: Family History  Problem Relation Age of Onset  . Parkinson's disease Maternal Grandmother   . Drug abuse Maternal Grandfather   . Cancer Paternal Grandmother   . Cancer Paternal Grandfather   . Drug abuse Paternal Grandfather     SOCIAL HISTORY: Social History   Social History  . Marital status: Single    Spouse name: N/A  . Number of children: 0  . Years of education: Bachelors   Occupational History  .  Un[employer]   Social History Main Topics  . Smoking status: Former Games developer  . Smokeless tobacco: Never Used     Comment: quit smoking 3/10  . Alcohol use No     Comment: quit alcohol  02/2003-recovering alcohol abuser  . Drug use: No  . Sexual activity: Not on file   Other Topics Concern  . Not on file   Social History Narrative       Patient is single, recovering ETOH abuser. Patient has her bachelors degree  Patient is working on her masters at [employer].. 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    Patient is right-handed.   Patient does not drink any caffeine.                  PHYSICAL EXAM  Vitals:   06/12/16 1015  BP: 108/66  Pulse: 90  Resp: 14  Weight: 129 lb (58.5 kg)  Height: 5\' 3"  (1.6 m)   Body mass index is 22.85 kg/m.  Generalized: Well developed, in no acute distress   Neurological examination  Mentation: Alert oriented to time, place, history taking. Follows all commands speech and language fluent Cranial nerve :  No change in taste and smell- Pupils were equal round reactive to light. Her visual field were full on confrontational test. Facial sensation and strength were normal. Uvula and tongue  Move in  Midline, no fibrillation and no tremor. Uvula is not swollen. . Head turning and shoulder shrug  were normal and symmetric. Motor: 5 over 5 strength of all 4 extremities.symmetric motor tone is noted throughout.  Sensory:intact to primary modalities, including soft touch on all 4 extremities.  Coordination: intact  finger-nose-finger bilaterally.  Gait and station: Gait is normal. Tandem gait is normal. Reflexes: Deep tendon reflexes are symmetric and normal bilaterally.   DIAGNOSTIC DATA (LABS, IMAGING, TESTING) - I reviewed patient records, labs, notes, testing and imaging myself where available.  she will have labs drawn at PCP office , later this month.  XYREM labs are due, I will ask to have her LFT drawn by PCP,  and a complete comprehensive metabolic panel.       ASSESSMENT AND PLAN   36 y.o. year old established patient with [facility] , for 10 or more years.   Now a Hydrologist"master in public affairs, non profit management "   1.  Narcolepsy, cataplexy on XYREM.  On Xyrem for 6 years without complication, labs drawn by PCP, we will ask her to draw labs q 6 month, Next visit with me or NP.     Arrietty Dercole, MD  06/12/2016, 10:27 AM [facility] [address], [address] [place], [place] [address] [phone]

## 2016-06-16 DIAGNOSIS — Z79899 Other long term (current) drug therapy: Secondary | ICD-10-CM | POA: Diagnosis not present

## 2016-06-16 DIAGNOSIS — Z0000 Encounter for general adult medical examination without abnormal findings: Secondary | ICD-10-CM | POA: Diagnosis not present

## 2016-07-31 ENCOUNTER — Telehealth: Payer: Self-pay

## 2016-07-31 NOTE — Telephone Encounter (Signed)
PA for xyrem completed and sent to [facility]. Should have a determination in 1-3 business days. 

## 2016-08-01 NOTE — Telephone Encounter (Signed)
[employer] approved xyrem pa. File ID: [id number]. Approved until 07/31/2017.  Xyrem [facility] pharmacy made aware.

## 2016-08-07 ENCOUNTER — Telehealth: Payer: Self-pay | Admitting: Neurology

## 2016-08-07 DIAGNOSIS — G4710 Hypersomnia, unspecified: Secondary | ICD-10-CM

## 2016-08-07 MED ORDER — AMPHETAMINE-DEXTROAMPHETAMINE 20 MG PO TABS: ORAL_TABLET | ORAL | 0 refills | Status: DC

## 2016-08-07 NOTE — Telephone Encounter (Signed)
Patient called office requesting refill for amphetamine-dextroamphetamine (ADDERALL) 20 MG tablet. °

## 2016-08-07 NOTE — Telephone Encounter (Signed)
Patient is up to date on appts and refill is due. Dr. Vickey Huger is out of the office. Rx printed and to be signed by Dr. Epimenio Foot (on-call). I will put at front desk once it is signed.

## 2016-08-07 NOTE — Addendum Note (Signed)
Addended by: Crisoforo Oxford on: 08/07/2016 10:16 AM   Modules accepted: Orders

## 2016-08-08 NOTE — Telephone Encounter (Signed)
LM that Rx is at the front desk ready for pick up

## 2016-09-04 ENCOUNTER — Telehealth: Payer: Self-pay | Admitting: Neurology

## 2016-09-04 DIAGNOSIS — G4710 Hypersomnia, unspecified: Secondary | ICD-10-CM

## 2016-09-04 MED ORDER — AMPHETAMINE-DEXTROAMPHETAMINE 20 MG PO TABS: ORAL_TABLET | ORAL | 0 refills | Status: DC

## 2016-09-04 NOTE — Telephone Encounter (Signed)
Pt request refill for amphetamine-dextroamphetamine (ADDERALL) 20 MG tablet . Pt is aware RX will be ready within 24 hrs unless otherwise informed by RN

## 2016-09-04 NOTE — Telephone Encounter (Signed)
Dr. Epimenio Foot signed the adderall RX. It has been placed at the front desk for pick up.

## 2016-09-04 NOTE — Telephone Encounter (Signed)
Pt is due for a refill on adderall and is up to date on her appts. I will will RX and give to Dr. Epimenio Foot to review, as WID, in Dr. Oliva Bustard absence.

## 2016-09-22 DIAGNOSIS — L708 Other acne: Secondary | ICD-10-CM | POA: Diagnosis not present

## 2016-09-22 DIAGNOSIS — L700 Acne vulgaris: Secondary | ICD-10-CM | POA: Diagnosis not present

## 2016-10-17 ENCOUNTER — Other Ambulatory Visit: Payer: Self-pay | Admitting: Neurology

## 2016-10-17 DIAGNOSIS — G4710 Hypersomnia, unspecified: Secondary | ICD-10-CM

## 2016-10-17 MED ORDER — AMPHETAMINE-DEXTROAMPHETAMINE 20 MG PO TABS: ORAL_TABLET | ORAL | 0 refills | Status: DC

## 2016-10-17 NOTE — Telephone Encounter (Signed)
Pt request refill for amphetamine-dextroamphetamine (ADDERALL) 20 MG tablet °

## 2016-10-17 NOTE — Telephone Encounter (Signed)
Placed printed/signed rx adderall up front for patient pick up.  

## 2016-11-21 ENCOUNTER — Other Ambulatory Visit: Payer: Self-pay | Admitting: Neurology

## 2016-11-21 DIAGNOSIS — G4710 Hypersomnia, unspecified: Secondary | ICD-10-CM

## 2016-11-21 DIAGNOSIS — G47419 Narcolepsy without cataplexy: Secondary | ICD-10-CM

## 2016-11-21 MED ORDER — SODIUM OXYBATE 500 MG/ML PO SOLN: 4500.0000 mg | Freq: Every day | ORAL | 5 refills | Status: DC

## 2016-11-23 ENCOUNTER — Telehealth: Payer: Self-pay | Admitting: Neurology

## 2016-11-23 NOTE — Telephone Encounter (Signed)
Kat with [facility] called office in reference to patient Sherry Rhodes.  She is needing Dr. Oliva Bustardohmeier's DEA and NPI #.  Please call [phone] opt [phone] then opt [facility].

## 2016-11-24 ENCOUNTER — Telehealth: Payer: Self-pay | Admitting: Neurology

## 2016-11-24 DIAGNOSIS — G4710 Hypersomnia, unspecified: Secondary | ICD-10-CM

## 2016-11-24 MED ORDER — AMPHETAMINE-DEXTROAMPHETAMINE 20 MG PO TABS: ORAL_TABLET | ORAL | 0 refills | Status: DC

## 2016-11-24 NOTE — Addendum Note (Signed)
Addended by: Melvyn NovasHMEIER, Kienan Doublin on: 11/24/2016 11:35 AM   Modules accepted: Orders

## 2016-11-24 NOTE — Telephone Encounter (Signed)
LVM informing patient her Adderall Rx is ready at front desk for pick up. Advised her the office is now closed, opens Monday at 8 am.

## 2016-11-24 NOTE — Telephone Encounter (Signed)
I renewed her prescription for adderall , CD

## 2016-11-24 NOTE — Telephone Encounter (Signed)
Patient called office requesting refill for amphetamine-dextroamphetamine (ADDERALL) 20 MG tablet. °

## 2016-12-13 ENCOUNTER — Encounter: Payer: Self-pay | Admitting: Adult Health

## 2016-12-13 ENCOUNTER — Ambulatory Visit (INDEPENDENT_AMBULATORY_CARE_PROVIDER_SITE_OTHER): Payer: 59 | Admitting: Adult Health

## 2016-12-13 VITALS — BP 105/64 | HR 96 | Ht 63.0 in | Wt 122.0 lb

## 2016-12-13 DIAGNOSIS — G47419 Narcolepsy without cataplexy: Secondary | ICD-10-CM

## 2016-12-13 DIAGNOSIS — F329 Major depressive disorder, single episode, unspecified: Secondary | ICD-10-CM | POA: Diagnosis not present

## 2016-12-13 DIAGNOSIS — Z5181 Encounter for therapeutic drug level monitoring: Secondary | ICD-10-CM | POA: Diagnosis not present

## 2016-12-13 DIAGNOSIS — F32A Depression, unspecified: Secondary | ICD-10-CM

## 2016-12-13 NOTE — Progress Notes (Signed)
PATIENT: Ettel Albergo DOB: March 20, 1981  REASON FOR VISIT: follow up-narcolepsy HISTORY FROM: patient  HISTORY OF PRESENT ILLNESS: Today 12/13/16 Ms. Mayweather is a 36 year old female with a history of narcolepsy. She returns today for follow-up. She continues on Xyrem and Adderall. She states for the most part this works well for her. She states within the last week she has noticed more daytime sleepiness however she just returned from vacation. She denies any increase in her depression. She remains on Wellbutrin, Lamictal and Cymbalta. She denies any new neurological symptoms. She returns today for an evaluation.  HISTORY 06/12/16: have pleasure of seeing Ms. Helin again today, who has recently graduated and has begun working from home. Her narcolepsy related excessive daytime sleepiness remains well controlled on Xyrem with additional use of Adderall. She states that she is not feeling easily distracted, that she is not dosing or daydreaming while working from home. She lives alone but shares her household with 2 dogs.  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 She is taking Cymbalta for depression which was exchanged for Effexor. Weaning off Effexor she suffered temple rarely extreme tremors and dysesthesias. The little electric shock sensations were very unpleasant. Sleep habits : bedtime is now 9 PM , sleeping from 9.30 to 6 AM, no need for alarm , she needs neither to set an alarm for her second dose of Xyrem nor for her morning rise time. This has been entrained rhythm.   REVIEW OF SYSTEMS: Out of a complete 14 system review of symptoms, the patient complains only of the following symptoms, and all other reviewed systems are negative.  See HPI  ALLERGIES: Allergies  Allergen Reactions  . Bee Venom     HOME MEDICATIONS: Outpatient Medications Prior to Visit  Medication Sig Dispense Refill  . acyclovir (ZOVIRAX) 400 MG tablet   10  .  amphetamine-dextroamphetamine (ADDERALL) 20 MG tablet Take one tablet po QAM, one tablet at 12 noon and one half tablet at 4pm 75 tablet 0  . buPROPion (WELLBUTRIN XL) 150 MG 24 hr tablet 450 mg.   5  . DULoxetine HCl 40 MG CPEP 40 mg.     . EPIDUO 0.1-2.5 % gel   11  . lamoTRIgine (LAMICTAL) 100 MG tablet Take 100 mg by mouth daily.    [name] levonorgestrel (MIRENA) 20 MCG/24HR IUD 1 each by Intrauterine route once.    . loratadine (CLARITIN) 10 MG tablet Take 10 mg by mouth daily.    . Sodium Oxybate (XYREM) 500 MG/ML SOLN Take 9 mLs (4,500 mg total) by mouth daily. Take twice at night 4.5 gram , 4500 mg or 9 ml. . 3 Bottle 5   No facility-administered medications prior to visit.     PAST MEDICAL HISTORY: Past Medical History:  Diagnosis Date  . Controlled narcolepsy 05/27/2013  . Depression   . EDS (Ehlers-Danlos syndrome)    persistent excessive daytime sleepiness  . Hypersomnia, persistent    MSLT no SREM but lass than 8 minutes SL.   [name] PTSD (post-traumatic stress disorder)   . Recovering alcoholic (HCC)     PAST SURGICAL HISTORY: Past Surgical History:  Procedure Laterality Date  . LASIK Right 6/12   good success- vision correction    FAMILY HISTORY: Family History  Problem Relation Age of Onset  . Parkinson's disease Maternal Grandmother   . Drug abuse Maternal Grandfather   . Cancer Paternal Grandmother   . Cancer Paternal Grandfather   . Drug  abuse Paternal Grandfather     SOCIAL HISTORY: Social History   Social History  . Marital status: Single    Spouse name: N/A  . Number of children: 0  . Years of education: Bachelors   Occupational History  .  [employer]   Social History Main Topics  . Smoking status: Former Games developermoker  . Smokeless tobacco: Never Used     Comment: quit smoking 3/10  . Alcohol use No     Comment: quit alcohol 02/2003-recovering alcohol abuser  . Drug use: No  . Sexual activity: Not on file   Other Topics Concern  . Not on file   Social  History Narrative       Patient is single, recovering ETOH abuser. Patient has her bachelors degree  Patient is working on her masters at [employer].. 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    Patient is right-handed.   Patient does not drink any caffeine.                  PHYSICAL EXAM  Vitals:   12/13/16 0905  BP: 105/64  Pulse: 96  Weight: 122 lb (55.3 kg)  Height: 5\' 3"  (1.6 m)   Body mass index is 21.61 kg/m.  Generalized: Well developed, in no acute distress   Neurological examination  Mentation: Alert oriented to time, place, history taking. Follows all commands speech and language fluent Cranial nerve II-XII: Pupils were equal round reactive to light. Extraocular movements were full, visual field were full on confrontational test. Facial sensation and strength were normal. Uvula tongue midline. Head turning and shoulder shrug  were normal and symmetric. Motor: The motor testing reveals 5 over 5 strength of all 4 extremities. Good symmetric motor tone is noted throughout.  Sensory: Sensory testing is intact to soft touch on all 4 extremities. No evidence of extinction is noted.  Coordination: Cerebellar testing reveals good finger-nose-finger and heel-to-shin bilaterally.  Gait and station: Gait is normal.  Reflexes: Deep tendon reflexes are symmetric and normal bilaterally.   DIAGNOSTIC DATA (LABS, IMAGING, TESTING) - I reviewed patient records, labs, notes, testing and imaging myself where available.      Component Value Date/Time   NA 136 12/08/2014 1632   K 4.3 12/08/2014 1632   CL 98 12/08/2014 1632   CO2 24 12/08/2014 1632   GLUCOSE 94 12/08/2014 1632   BUN 12 12/08/2014 1632   CREATININE 1.27 (H) 12/08/2014 1632   CALCIUM 9.2 12/08/2014 1632   PROT 6.5 12/08/2014 1632   ALBUMIN 4.3 12/08/2014 1632   AST 15 12/08/2014 1632   ALT 33 (H) 12/08/2014 1632   ALKPHOS 50 12/08/2014 1632   BILITOT <0.2 12/08/2014 1632   GFRNONAA 55 (L) 12/08/2014 1632    GFRAA 64 12/08/2014 1632      ASSESSMENT AND PLAN 36 y.o. year old female  has a past medical history of Controlled narcolepsy (05/27/2013); Depression; EDS (Ehlers-Danlos syndrome); Hypersomnia, persistent; PTSD (post-traumatic stress disorder); and Recovering alcoholic (HCC). here with:  1. Narcolepsy 2. Depression  Overall the patient is doing well. She will continue on Xyrem and Adderall. Her vital signs are stable. She reports that her depression is under good control. We will check blood work today. She is advised that if her symptoms worsen or she develops new symptoms she should let us know. She will follow-up in 6 months or sooner if needed.     Butch PennyMegan Russie Gulledge, MSN, NP-C 12/13/2016, 8:59 AM [facility] [address], [address]  [place], [place] [address] [phone]

## 2016-12-13 NOTE — Patient Instructions (Signed)
Continue Xyrem and Adderall Blood work today If your symptoms worsen or you develop new symptoms please let us know.

## 2016-12-14 LAB — COMPREHENSIVE METABOLIC PANEL
ALBUMIN: 4.6 g/dL (ref 3.5–5.5)
ALK PHOS: 53 IU/L (ref 39–117)
ALT: 11 IU/L (ref 0–32)
AST: 13 IU/L (ref 0–40)
Albumin/Globulin Ratio: 1.6 (ref 1.2–2.2)
BUN / CREAT RATIO: 18 (ref 9–23)
BUN: 18 mg/dL (ref 6–20)
Bilirubin Total: 0.3 mg/dL (ref 0.0–1.2)
CALCIUM: 9.9 mg/dL (ref 8.7–10.2)
CO2: 24 mmol/L (ref 20–29)
CREATININE: 1.02 mg/dL — AB (ref 0.57–1.00)
Chloride: 99 mmol/L (ref 96–106)
GFR calc non Af Amer: 71 mL/min/{1.73_m2} (ref >59)
GFR, EST AFRICAN AMERICAN: 82 mL/min/{1.73_m2} (ref >59)
GLUCOSE: 85 mg/dL (ref 65–99)
Globulin, Total: 2.8 g/dL (ref 1.5–4.5)
Potassium: 4.6 mmol/L (ref 3.5–5.2)
Sodium: 140 mmol/L (ref 134–144)
TOTAL PROTEIN: 7.4 g/dL (ref 6.0–8.5)

## 2016-12-14 NOTE — Progress Notes (Signed)
I agree with the assessment and plan as directed by NP .The patient is known to me .   Zak Gondek, MD  

## 2017-01-03 ENCOUNTER — Other Ambulatory Visit: Payer: Self-pay | Admitting: Adult Health

## 2017-01-03 DIAGNOSIS — G4710 Hypersomnia, unspecified: Secondary | ICD-10-CM

## 2017-01-03 NOTE — Telephone Encounter (Signed)
Patient requesting refill of amphetamine-dextroamphetamine (ADDERALL) 20 MG tablet. ° ° °

## 2017-01-03 NOTE — Addendum Note (Signed)
Addended by: Guy BeginYOUNG, Corie Allis S on: 01/03/2017 09:42 AM   Modules accepted: Orders

## 2017-01-04 MED ORDER — AMPHETAMINE-DEXTROAMPHETAMINE 20 MG PO TABS: ORAL_TABLET | ORAL | 0 refills | Status: DC

## 2017-02-08 ENCOUNTER — Telehealth: Payer: Self-pay | Admitting: Adult Health

## 2017-02-08 DIAGNOSIS — G4710 Hypersomnia, unspecified: Secondary | ICD-10-CM

## 2017-02-08 MED ORDER — AMPHETAMINE-DEXTROAMPHETAMINE 20 MG PO TABS: ORAL_TABLET | ORAL | 0 refills | Status: DC

## 2017-02-08 NOTE — Telephone Encounter (Signed)
Adderall refill Rx at front desk for pick up. 

## 2017-02-08 NOTE — Telephone Encounter (Signed)
Pt request refill for amphetamine-dextroamphetamine (ADDERALL) 20 MG tablet . Pt is aware clinic closes at noon tomorrow °

## 2017-02-08 NOTE — Telephone Encounter (Signed)
Order printed, signed, and given to Tresa Endo.

## 2017-03-19 ENCOUNTER — Telehealth: Payer: Self-pay | Admitting: Neurology

## 2017-03-19 ENCOUNTER — Other Ambulatory Visit: Payer: Self-pay | Admitting: Neurology

## 2017-03-19 DIAGNOSIS — G4710 Hypersomnia, unspecified: Secondary | ICD-10-CM

## 2017-03-19 MED ORDER — AMPHETAMINE-DEXTROAMPHETAMINE 20 MG PO TABS: ORAL_TABLET | ORAL | 0 refills | Status: DC

## 2017-03-19 NOTE — Telephone Encounter (Signed)
Script will be ready at the front after lunch

## 2017-03-19 NOTE — Telephone Encounter (Signed)
Patient requesting refill of amphetamine-dextroamphetamine (ADDERALL) 20 MG tablet. ° ° °

## 2017-04-24 ENCOUNTER — Other Ambulatory Visit: Payer: Self-pay | Admitting: Adult Health

## 2017-04-24 ENCOUNTER — Other Ambulatory Visit: Payer: Self-pay | Admitting: Neurology

## 2017-04-24 DIAGNOSIS — G4710 Hypersomnia, unspecified: Secondary | ICD-10-CM

## 2017-04-24 DIAGNOSIS — G47419 Narcolepsy without cataplexy: Secondary | ICD-10-CM

## 2017-04-24 MED ORDER — SODIUM OXYBATE 500 MG/ML PO SOLN: 4500.0000 mg | Freq: Every day | ORAL | 5 refills | Status: DC

## 2017-04-24 MED ORDER — AMPHETAMINE-DEXTROAMPHETAMINE 20 MG PO TABS: ORAL_TABLET | ORAL | 0 refills | Status: DC

## 2017-04-24 NOTE — Telephone Encounter (Signed)
Pt request refill for amphetamine-dextroamphetamine (ADDERALL) 20 MG tablet °

## 2017-04-26 NOTE — Telephone Encounter (Signed)
Rx at the front desk.

## 2017-05-02 ENCOUNTER — Telehealth: Payer: Self-pay | Admitting: Neurology

## 2017-05-02 NOTE — Telephone Encounter (Signed)
Pt calling she was advised by [facility] amphetamine-dextroamphetamine (ADDERALL) 20 MG tablet needs PA

## 2017-05-03 NOTE — Telephone Encounter (Signed)
Medication approved by [facility]- for coverage until 05/03/18.  File ID- [id number]

## 2017-05-03 NOTE — Telephone Encounter (Signed)
PA sent to [facility]. KEY: [id number] Will wait for a reply and can take up to 72 hours.

## 2017-06-08 ENCOUNTER — Telehealth: Payer: Self-pay | Admitting: Neurology

## 2017-06-08 DIAGNOSIS — G4710 Hypersomnia, unspecified: Secondary | ICD-10-CM

## 2017-06-08 MED ORDER — AMPHETAMINE-DEXTROAMPHETAMINE 20 MG PO TABS: ORAL_TABLET | ORAL | 0 refills | Status: DC

## 2017-06-08 NOTE — Telephone Encounter (Signed)
Pt is requesting a refill for amphetamine-dextroamphetamine (ADDERALL) 20 MG tablet is aware the office will be closing at noon today

## 2017-06-08 NOTE — Telephone Encounter (Signed)
Prescription of Adderall 20 mg tablet reordered and printed for MD signature.

## 2017-06-18 ENCOUNTER — Ambulatory Visit: Payer: 59 | Admitting: Neurology

## 2017-06-18 ENCOUNTER — Telehealth: Payer: Self-pay

## 2017-06-18 NOTE — Telephone Encounter (Signed)
Sherry Rhodes has openings and the pt can be rescheuled with NP since it was a follow up

## 2017-06-18 NOTE — Telephone Encounter (Signed)
I called pt, no answer, left a detailed message on cell phone, per DPR advising her that Dr. Vickey Hugerohmeier is not in the office this morning and her appt will need to be rescheduled. I asked pt to call us back to get her appt rescheduled.

## 2017-06-19 ENCOUNTER — Ambulatory Visit: Payer: 59 | Admitting: Adult Health

## 2017-06-19 ENCOUNTER — Encounter: Payer: Self-pay | Admitting: Adult Health

## 2017-06-19 VITALS — BP 117/74 | HR 95 | Ht 63.0 in | Wt 132.0 lb

## 2017-06-19 DIAGNOSIS — G47419 Narcolepsy without cataplexy: Secondary | ICD-10-CM

## 2017-06-19 DIAGNOSIS — Z5181 Encounter for therapeutic drug level monitoring: Secondary | ICD-10-CM

## 2017-06-19 NOTE — Progress Notes (Signed)
PATIENT: Sherry Rhodes DOB: 01/28/1981  REASON FOR VISIT: follow up HISTORY FROM: patient  HISTORY OF PRESENT ILLNESS: Today 06/19/17  Ms. Sherry Rhodes is a 37 year old female with a history of narcolepsy.  She returns today for follow-up.  She remains on Xyrem and Adderall.  She reports that the medication works well for her.  She denies falling asleep driving or at work.  She denies any worsening depression.  She reports that her primary care is retiring so she is curious if we will take over her Wellbutrin, Cymbalta and Lamictal.  She returns today for an evaluation.  HISTORY 12/13/16 [name] is a 37 year old female with a history of narcolepsy. She returns today for follow-up. She continues on Xyrem and Adderall. She states for the most part this works well for her. She states within the last week she has noticed more daytime sleepiness however she just returned from vacation. She denies any increase in her depression. She remains on Wellbutrin, Lamictal and Cymbalta. She denies any new neurological symptoms. She returns today for an evaluation  REVIEW OF SYSTEMS: Out of a complete 14 system review of symptoms, the patient complains only of the following symptoms, and all other reviewed systems are negative.  See HPI  ALLERGIES: Allergies  Allergen Reactions  . Bee Venom     HOME MEDICATIONS: Outpatient Medications Prior to Visit  Medication Sig Dispense Refill  . acyclovir (ZOVIRAX) 400 MG tablet   10  . amphetamine-dextroamphetamine (ADDERALL) 20 MG tablet Take one tablet po QAM, one tablet at 12 noon and one half tablet at 4pm 75 tablet 0  . buPROPion (WELLBUTRIN XL) 150 MG 24 hr tablet 450 mg.   5  . DULoxetine HCl 40 MG CPEP 40 mg.     . EPIDUO 0.1-2.5 % gel   11  . lamoTRIgine (LAMICTAL) 100 MG tablet Take 100 mg by mouth daily.    [name] levonorgestrel (MIRENA) 20 MCG/24HR IUD 1 each by Intrauterine route once.    . loratadine (CLARITIN) 10 MG tablet Take 10 mg by mouth  daily.    . Sodium Oxybate (XYREM) 500 MG/ML SOLN Take 9 mLs (4,500 mg total) by mouth daily. Take twice at night 4.5 gram , 4500 mg or 9 ml. . 3 Bottle 5   No facility-administered medications prior to visit.     PAST MEDICAL HISTORY: Past Medical History:  Diagnosis Date  . Controlled narcolepsy 05/27/2013  . Depression   . EDS (Ehlers-Danlos syndrome)    persistent excessive daytime sleepiness  . Hypersomnia, persistent    MSLT no SREM but lass than 8 minutes SL.   [name] PTSD (post-traumatic stress disorder)   . Recovering alcoholic (HCC)     PAST SURGICAL HISTORY: Past Surgical History:  Procedure Laterality Date  . LASIK Right 6/12   good success- vision correction    FAMILY HISTORY: Family History  Problem Relation Age of Onset  . Parkinson's disease Maternal Grandmother   . Drug abuse Maternal Grandfather   . Cancer Paternal Grandmother   . Cancer Paternal Grandfather   . Drug abuse Paternal Grandfather     SOCIAL HISTORY: Social History   Socioeconomic History  . Marital status: Single    Spouse name: Not on file  . Number of children: 0  . Years of education: Bachelors  . Highest education level: Not on file  Social Needs  . Financial resource strain: Not on file  . Food insecurity - worry: Not on file  . Food  insecurity - inability: Not on file  . Transportation needs - medical: Not on file  . Transportation needs - non-medical: Not on file  Occupational History    Employer: [employer]  Tobacco Use  . Smoking status: Former Games developermoker  . Smokeless tobacco: Never Used  . Tobacco comment: quit smoking 3/10  Substance and Sexual Activity  . Alcohol use: No    Comment: quit alcohol 02/2003-recovering alcohol abuser  . Drug use: No  . Sexual activity: Not on file  Other Topics Concern  . Not on file  Social History Narrative       Patient is single, recovering ETOH abuser. Patient has her bachelors degree  Patient has her masters from [employer].. Patient works for  Al[employer] services.    Patient is right-handed.   Patient does not drink any caffeine.               PHYSICAL EXAM  Vitals:   06/19/17 1319  BP: 117/74  Pulse: 95  Weight: 132 lb (59.9 kg)  Height: 5\' 3"  (1.6 m)   Body mass index is 23.38 kg/m.  Generalized: Well developed, in no acute distress   Neurological examination  Mentation: Alert oriented to time, place, history taking. Follows all commands speech and language fluent Cranial nerve II-XII: Pupils were equal round reactive to light. Extraocular movements were full, visual field were full on confrontational test. Facial sensation and strength were normal. Uvula tongue midline. Head turning and shoulder shrug  were normal and symmetric. Motor: The motor testing reveals 5 over 5 strength of all 4 extremities. Good symmetric motor tone is noted throughout.  Sensory: Sensory testing is intact to soft touch on all 4 extremities. No evidence of extinction is noted.  Coordination: Cerebellar testing reveals good finger-nose-finger and heel-to-shin bilaterally.  Gait and station: Gait is normal. Tandem gait is normal. Romberg is negative. No drift is seen.  Reflexes: Deep tendon reflexes are symmetric and normal bilaterally.   DIAGNOSTIC DATA (LABS, IMAGING, TESTING) - I reviewed patient records, labs, notes, testing and imaging myself where available.  No results found for: WBC, HGB, HCT, MCV, PLT    Component Value Date/Time   NA 140 12/13/2016 0924   K 4.6 12/13/2016 0924   CL 99 12/13/2016 0924   CO2 24 12/13/2016 0924   GLUCOSE 85 12/13/2016 0924   BUN 18 12/13/2016 0924   CREATININE 1.02 (H) 12/13/2016 0924   CALCIUM 9.9 12/13/2016 0924   PROT 7.4 12/13/2016 0924   ALBUMIN 4.6 12/13/2016 0924   AST 13 12/13/2016 0924   ALT 11 12/13/2016 0924   ALKPHOS 53 12/13/2016 0924   BILITOT 0.3 12/13/2016 0924   GFRNONAA 71 12/13/2016 0924   GFRAA 82 12/13/2016 0924   No results found for: CHOL, HDL, LDLCALC,  LDLDIRECT, TRIG, CHOLHDL No results found for: ZOXW9UHGBA1C No results found for: VITAMINB12 No results found for: TSH    ASSESSMENT AND PLAN 37 y.o. year old female  has a past medical history of Controlled narcolepsy (05/27/2013), Depression, EDS (Ehlers-Danlos syndrome), Hypersomnia, persistent, PTSD (post-traumatic stress disorder), and Recovering alcoholic (HCC). here with:  1.  Narcolepsy  Overall patient is doing well.  She will continue on Xyrem and Adderall.  Advised that I will consult with Dr. Vickey Hugerohmeier about taking over Cymbalta, Lamictal and Wellbutrin.  Patient advised that she had her back within 1 week she should give our office a call.  Also advised that if her symptoms worsen or she develops new symptoms  she should let us know.  She will follow-up in 6 months or sooner if needed.  I spent 15 minutes with the patient. 50% of this time was spent Discussing her medications.   Butch Penny, MSN, NP-C 06/19/2017, 1:30 PM [facility] [address] [place], [place] [address] [phone]

## 2017-06-19 NOTE — Patient Instructions (Addendum)
Your Plan:  Continue Xyrem and adderall  Blood work today If your symptoms worsen or you develop new symptoms please let us know.   Thank you for coming to see [facility] at [facility]. I hope we have been able to provide you high quality care today.  You may receive a patient satisfaction survey over the next few weeks. We would appreciate your feedback and comments so that we may continue to improve ourselves and the health of our patients.

## 2017-06-20 ENCOUNTER — Telehealth: Payer: Self-pay | Admitting: *Deleted

## 2017-06-20 LAB — COMPREHENSIVE METABOLIC PANEL
A/G RATIO: 1.7 (ref 1.2–2.2)
ALBUMIN: 4.7 g/dL (ref 3.5–5.5)
ALK PHOS: 62 IU/L (ref 39–117)
ALT: 14 IU/L (ref 0–32)
AST: 13 IU/L (ref 0–40)
BUN / CREAT RATIO: 14 (ref 9–23)
BUN: 15 mg/dL (ref 6–20)
CHLORIDE: 100 mmol/L (ref 96–106)
CO2: 25 mmol/L (ref 20–29)
CREATININE: 1.04 mg/dL — AB (ref 0.57–1.00)
Calcium: 9.9 mg/dL (ref 8.7–10.2)
GFR calc Af Amer: 80 mL/min/{1.73_m2} (ref >59)
GFR calc non Af Amer: 69 mL/min/{1.73_m2} (ref >59)
GLOBULIN, TOTAL: 2.8 g/dL (ref 1.5–4.5)
Glucose: 89 mg/dL (ref 65–99)
POTASSIUM: 4.4 mmol/L (ref 3.5–5.2)
SODIUM: 143 mmol/L (ref 134–144)
Total Protein: 7.5 g/dL (ref 6.0–8.5)

## 2017-06-20 NOTE — Telephone Encounter (Signed)
LVM informing patient her lab work is consistent with her previous lab work.  Left number for any questions.

## 2017-06-21 NOTE — Progress Notes (Signed)
I agree with the assessment and plan as directed by NP .The patient is known to me .   Nyala Kirchner, MD  

## 2017-07-18 ENCOUNTER — Other Ambulatory Visit: Payer: Self-pay | Admitting: Adult Health

## 2017-07-18 ENCOUNTER — Telehealth: Payer: Self-pay | Admitting: Neurology

## 2017-07-18 DIAGNOSIS — G4710 Hypersomnia, unspecified: Secondary | ICD-10-CM

## 2017-07-18 MED ORDER — AMPHETAMINE-DEXTROAMPHETAMINE 20 MG PO TABS: ORAL_TABLET | ORAL | 0 refills | Status: DC

## 2017-07-18 NOTE — Telephone Encounter (Signed)
[place] Drug Registry checked- last refilled 06/13/17.

## 2017-07-18 NOTE — Telephone Encounter (Signed)
Pt request refill for amphetamine-dextroamphetamine (ADDERALL) 20 MG tablet °

## 2017-07-18 NOTE — Telephone Encounter (Signed)
PA completed through cover my meds for Xyrem. KEY AMDFBC. Will take up to 3 days before hearing back.

## 2017-07-19 NOTE — Telephone Encounter (Signed)
PA approved for the patient through [facility]. Approved until 07/19/2018.  PA- [id number]

## 2017-08-24 ENCOUNTER — Telehealth: Payer: Self-pay | Admitting: Adult Health

## 2017-08-24 ENCOUNTER — Other Ambulatory Visit: Payer: Self-pay | Admitting: Neurology

## 2017-08-24 DIAGNOSIS — G4710 Hypersomnia, unspecified: Secondary | ICD-10-CM

## 2017-08-24 MED ORDER — AMPHETAMINE-DEXTROAMPHETAMINE 20 MG PO TABS: ORAL_TABLET | ORAL | 0 refills | Status: DC

## 2017-08-24 NOTE — Telephone Encounter (Signed)
I have routed it to the MD on call to address filling it.

## 2017-08-24 NOTE — Telephone Encounter (Signed)
Pt request refill for amphetamine-dextroamphetamine (ADDERALL) 20 MG tablet sent to [facility]

## 2017-08-28 ENCOUNTER — Telehealth: Payer: Self-pay | Admitting: Adult Health

## 2017-08-28 NOTE — Telephone Encounter (Signed)
Called pt & LVM (ok per DPR) informing her that Dr. Vickey Hugerohmeier has been out of the office and Aundra MilletMegan will talk to her about the Lamictal when she gets back. RN advised the patient that if she is going to be running out of the medication soon to please call office back and we can refill for 1 month until it can be further discussed. Left office number in message.

## 2017-08-28 NOTE — Telephone Encounter (Signed)
Pt states she spoke with NP Megan who agreed to write the refills for her medications .  Pt is now asking for a refill for her lamoTRIgine (LAMICTAL) 100 MG tablet Please send to  [phone] [place] - [address] [facility] [address] [facility] [phone] (Phone) [phone] (Fax)

## 2017-08-28 NOTE — Telephone Encounter (Signed)
Spoke with patient and clarified that she takes Lamotrigine 100 mg, 1 tablet daily. She is aware that RN will clarify with NP if our office will be taking over the refills of her medication. Pt is aware that she will only receive another call back if the medication cannot be refilled. She verbalized understanding and appreciation.

## 2017-08-28 NOTE — Telephone Encounter (Signed)
Please let her know that Dr. Vickey Hugerohmeier has been out of the office. I will discuss with her once she returns. If she is going to run out of lamictal- we can give her a 1 month supply until I can speak with Dr. Vickey Hugerohmeier.

## 2017-08-29 NOTE — Telephone Encounter (Signed)
Tried to get in touch with pt once more and LVM offering a refill for 1 month of Lamictal if pt needs until this can be discussed with the MD when she returns to the office. Left office number in message for pt to call back.

## 2017-08-30 ENCOUNTER — Other Ambulatory Visit: Payer: Self-pay | Admitting: Neurology

## 2017-08-30 MED ORDER — LAMOTRIGINE 100 MG PO TABS: 100.0000 mg | ORAL_TABLET | Freq: Every day | ORAL | 0 refills | Status: DC

## 2017-08-30 NOTE — Addendum Note (Signed)
Addended by: Bertram SavinULBERTSON, Caleb Prigmore L on: 08/30/2017 04:24 PM   Modules accepted: Orders

## 2017-08-30 NOTE — Telephone Encounter (Signed)
Called pt one last time to try to reach her about the Lamotrigine. Spoke with the patient and she stated that she is out and does need a refill. RN informed pt we will send in a month supply right now until [facility] can speak with the MD. Pt verbalized appreciation. Confirmed she is taking Lamotrigine 100 mg daily by mouth and would [facility] [address].   Lamotrigine 100 mg e-scribed to pharmacy. Receipt confirmed by pharmacy.

## 2017-09-14 ENCOUNTER — Telehealth: Payer: Self-pay | Admitting: Adult Health

## 2017-09-14 NOTE — Telephone Encounter (Signed)
Pt states that NP Aundra Millet was supposed to speak with Dr Vickey Huger re: her   DULoxetine HCl 40 MG CPEP   Pt does not have anymore as of yesterday and wants to know if it can be filled here or if she needs to try and get thru her PCP.  Please call

## 2017-09-14 NOTE — Telephone Encounter (Signed)
Spoke with Dr. Vickey Huger. She has advised for pt to get refills of Lamictal, Cymbalta and Wellbutrin from PCP. Called pt and discussed this. She verbalized understanding and had no further questions.

## 2017-09-18 DIAGNOSIS — Z91038 Other insect allergy status: Secondary | ICD-10-CM | POA: Diagnosis not present

## 2017-09-18 DIAGNOSIS — G4710 Hypersomnia, unspecified: Secondary | ICD-10-CM | POA: Diagnosis not present

## 2017-09-25 ENCOUNTER — Other Ambulatory Visit: Payer: Self-pay | Admitting: Neurology

## 2017-09-25 DIAGNOSIS — G47419 Narcolepsy without cataplexy: Secondary | ICD-10-CM

## 2017-09-25 DIAGNOSIS — G4710 Hypersomnia, unspecified: Secondary | ICD-10-CM

## 2017-09-25 MED ORDER — SODIUM OXYBATE 500 MG/ML PO SOLN: 4500.0000 mg | Freq: Every day | ORAL | 5 refills | Status: DC

## 2017-09-28 ENCOUNTER — Telehealth: Payer: Self-pay | Admitting: Adult Health

## 2017-09-28 ENCOUNTER — Other Ambulatory Visit: Payer: Self-pay | Admitting: Neurology

## 2017-09-28 DIAGNOSIS — G4710 Hypersomnia, unspecified: Secondary | ICD-10-CM

## 2017-09-28 MED ORDER — AMPHETAMINE-DEXTROAMPHETAMINE 20 MG PO TABS: ORAL_TABLET | ORAL | 0 refills | Status: DC

## 2017-09-28 NOTE — Telephone Encounter (Signed)
Pt request refill for amphetamine-dextroamphetamine (ADDERALL) 20 MG tablet sent to [facility]. Pt is aware the clinic closes and noon today and will be closed on Monday for the holidays

## 2017-10-01 ENCOUNTER — Other Ambulatory Visit: Payer: Self-pay | Admitting: Neurology

## 2017-10-02 ENCOUNTER — Other Ambulatory Visit: Payer: Self-pay | Admitting: Neurology

## 2017-10-02 DIAGNOSIS — G4710 Hypersomnia, unspecified: Secondary | ICD-10-CM

## 2017-10-02 MED ORDER — AMPHETAMINE-DEXTROAMPHETAMINE 20 MG PO TABS: ORAL_TABLET | ORAL | 0 refills | Status: DC

## 2017-11-06 ENCOUNTER — Other Ambulatory Visit: Payer: Self-pay | Admitting: Adult Health

## 2017-11-06 NOTE — Telephone Encounter (Signed)
Pt request refill for amphetamine-dextroamphetamine (ADDERALL) 20 MG tablet sent to [facility] °

## 2017-11-07 MED ORDER — AMPHETAMINE-DEXTROAMPHETAMINE 20 MG PO TABS: ORAL_TABLET | ORAL | 0 refills | Status: DC

## 2017-11-07 NOTE — Telephone Encounter (Signed)
[place] drug registry checked. 

## 2017-12-19 ENCOUNTER — Encounter: Payer: Self-pay | Admitting: Adult Health

## 2017-12-19 ENCOUNTER — Ambulatory Visit: Payer: 59 | Admitting: Adult Health

## 2017-12-19 VITALS — BP 110/78 | HR 78 | Ht 63.0 in | Wt 131.0 lb

## 2017-12-19 DIAGNOSIS — Z5181 Encounter for therapeutic drug level monitoring: Secondary | ICD-10-CM

## 2017-12-19 DIAGNOSIS — G47419 Narcolepsy without cataplexy: Secondary | ICD-10-CM

## 2017-12-19 MED ORDER — AMPHETAMINE-DEXTROAMPHETAMINE 20 MG PO TABS: ORAL_TABLET | ORAL | 0 refills | Status: DC

## 2017-12-19 NOTE — Progress Notes (Signed)
PATIENT: Sherry Rhodes DOB: 12/05/1980  REASON FOR VISIT: follow up HISTORY FROM: patient  HISTORY OF PRESENT ILLNESS: Today 12/19/17:  Sherry Rhodes is a 37 year old female with a history of narcolepsy.  She returns today for follow-up.  She continues on Xyrem and Adderall.  She reports that this medication continues to work well for her.  She has a full-time job with no difficulty.  She drives without difficulty.  Denies any worsening of depression.  Denies suicidal thoughts.  She returns today for evaluation.   HISTORY 06/19/17  Sherry Rhodes is a 37 year old female with a history of narcolepsy.  She returns today for follow-up.  She remains on Xyrem and Adderall.  She reports that the medication works well for her.  She denies falling asleep driving or at work.  She denies any worsening depression.  She reports that her primary care is retiring so she is curious if we will take over her Wellbutrin, Cymbalta and Lamictal.  She returns today for an evaluation.   REVIEW OF SYSTEMS: Out of a complete 14 system review of symptoms, the patient complains only of the following symptoms, and all other reviewed systems are negative.  See HPI  ALLERGIES: Allergies  Allergen Reactions  . Bee Venom     HOME MEDICATIONS: Outpatient Medications Prior to Visit  Medication Sig Dispense Refill  . acyclovir (ZOVIRAX) 400 MG tablet   10  . amphetamine-dextroamphetamine (ADDERALL) 20 MG tablet Take one tablet in AM, one tablet at 12 noon, and one half tablet at 4 pm. 75 tablet 0  . buPROPion (WELLBUTRIN XL) 150 MG 24 hr tablet 450 mg.   5  . DULoxetine HCl 40 MG CPEP 40 mg.     . lamoTRIgine (LAMICTAL) 100 MG tablet TAKE 1 TABLET(100 MG) BY MOUTH DAILY 30 tablet 6  . levonorgestrel (MIRENA) 20 MCG/24HR IUD 1 each by Intrauterine route once.    . loratadine (CLARITIN) 10 MG tablet Take 10 mg by mouth daily.    . Sodium Oxybate (XYREM) 500 MG/ML SOLN Take 9 mLs (4,500 mg total) by mouth daily.  Take twice at night 4.5 gram , 4500 mg or 9 ml. . 3 Bottle 5  . EPIDUO 0.1-2.5 % gel   11   No facility-administered medications prior to visit.     PAST MEDICAL HISTORY: Past Medical History:  Diagnosis Date  . Controlled narcolepsy 05/27/2013  . Depression   . EDS (Ehlers-Danlos syndrome)    persistent excessive daytime sleepiness  . Hypersomnia, persistent    MSLT no SREM but lass than 8 minutes SL.   [name]. PTSD (post-traumatic stress disorder)   . Recovering alcoholic (HCC)     PAST SURGICAL HISTORY: Past Surgical History:  Procedure Laterality Date  . LASIK Right 6/12   good success- vision correction    FAMILY HISTORY: Family History  Problem Relation Age of Onset  . Parkinson's disease Maternal Grandmother   . Drug abuse Maternal Grandfather   . Cancer Paternal Grandmother   . Cancer Paternal Grandfather   . Drug abuse Paternal Grandfather     SOCIAL HISTORY: Social History   Socioeconomic History  . Marital status: Single    Spouse name: Not on file  . Number of children: 0  . Years of education: Bachelors  . Highest education level: Not on file  Occupational History    Employer: [employer]  Social Needs  . Financial resource strain: Not on file  . Food insecurity:    Worry:  Not on file    Inability: Not on file  . Transportation needs:    Medical: Not on file    Non-medical: Not on file  Tobacco Use  . Smoking status: Former Games developer  . Smokeless tobacco: Never Used  . Tobacco comment: quit smoking 3/10  Substance and Sexual Activity  . Alcohol use: No    Comment: quit alcohol 02/2003-recovering alcohol abuser  . Drug use: No  . Sexual activity: Not on file  Lifestyle  . Physical activity:    Days per week: Not on file    Minutes per session: Not on file  . Stress: Not on file  Relationships  . Social connections:    Talks on phone: Not on file    Gets together: Not on file    Attends religious service: Not on file    Active member of  club or organization: Not on file    Attends meetings of clubs or organizations: Not on file    Relationship status: Not on file  . Intimate partner violence:    Fear of current or ex partner: Not on file    Emotionally abused: Not on file    Physically abused: Not on file    Forced sexual activity: Not on file  Other Topics Concern  . Not on file  Social History Narrative       Patient is single, recovering ETOH abuser. Patient has her bachelors degree  Patient has her masters from [employer].. 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    Patient is right-handed.   Patient does not drink any caffeine.               PHYSICAL EXAM  Vitals:   12/19/17 0839  BP: 110/78  Pulse: 78  Weight: 131 lb (59.4 kg)  Height: 5\' 3"  (1.6 m)   Body mass index is 23.21 kg/m.  Generalized: Well developed, in no acute distress   Neurological examination  Mentation: Alert oriented to time, place, history taking. Follows all commands speech and language fluent Cranial nerve II-XII: Pupils were equal round reactive to light. Extraocular movements were full, visual field were full on confrontational test. Facial sensation and strength were normal. Uvula tongue midline. Head turning and shoulder shrug  were normal and symmetric. Motor: The motor testing reveals 5 over 5 strength of all 4 extremities. Good symmetric motor tone is noted throughout.  Sensory: Sensory testing is intact to soft touch on all 4 extremities. No evidence of extinction is noted.  Coordination: Cerebellar testing reveals good finger-nose-finger and heel-to-shin bilaterally.  Gait and station: Gait is normal. Tandem gait is normal. Romberg is negative. No drift is seen.  Reflexes: Deep tendon reflexes are symmetric and normal bilaterally.   DIAGNOSTIC DATA (LABS, IMAGING, TESTING) - I reviewed patient records, labs, notes, testing and imaging myself where available.  No results found for: WBC, HGB, HCT, MCV, PLT      Component Value Date/Time   NA 143 06/19/2017 1346   K 4.4 06/19/2017 1346   CL 100 06/19/2017 1346   CO2 25 06/19/2017 1346   GLUCOSE 89 06/19/2017 1346   BUN 15 06/19/2017 1346   CREATININE 1.04 (H) 06/19/2017 1346   CALCIUM 9.9 06/19/2017 1346   PROT 7.5 06/19/2017 1346   ALBUMIN 4.7 06/19/2017 1346   AST 13 06/19/2017 1346   ALT 14 06/19/2017 1346   ALKPHOS 62 06/19/2017 1346   BILITOT <0.2 06/19/2017 1346   GFRNONAA 69 06/19/2017 1346  GFRAA 80 06/19/2017 1346    ASSESSMENT AND PLAN 37 y.o. year old female  has a past medical history of Controlled narcolepsy (05/27/2013), Depression, EDS (Ehlers-Danlos syndrome), Hypersomnia, persistent, PTSD (post-traumatic stress disorder), and Recovering alcoholic (HCC). here with:  1.  Narcolepsy without cataplexy  The patient will continue on Adderall and Xyrem.  I will check blood work today.  She is advised that if her symptoms worsen or she develops new symptoms she should let us know.  We will follow-up in 6 months or sooner if needed.    I spent 15 minutes with the patient. 50% of this time was spent reviewing her symptoms   Butch PennyMegan Federica Allport, MSN, NP-C 12/19/2017, 8:55 AM [facility] [address], [address] [place], [place] [id number] [phone]

## 2017-12-19 NOTE — Patient Instructions (Signed)
Your Plan:  Continue Adderall and Xyrem Blood work today If your symptoms worsen or you develop new symptoms please let us know.    Thank you for coming to see [facility] at [facility]. I hope we have been able to provide you high quality care today.  You may receive a patient satisfaction survey over the next few weeks. We would appreciate your feedback and comments so that we may continue to improve ourselves and the health of our patients.

## 2017-12-20 ENCOUNTER — Telehealth: Payer: Self-pay

## 2017-12-20 LAB — COMPREHENSIVE METABOLIC PANEL
ALBUMIN: 4.5 g/dL (ref 3.5–5.5)
ALT: 10 IU/L (ref 0–32)
AST: 11 IU/L (ref 0–40)
Albumin/Globulin Ratio: 1.9 (ref 1.2–2.2)
Alkaline Phosphatase: 55 IU/L (ref 39–117)
BILIRUBIN TOTAL: 0.3 mg/dL (ref 0.0–1.2)
BUN / CREAT RATIO: 9 (ref 9–23)
BUN: 8 mg/dL (ref 6–20)
CHLORIDE: 103 mmol/L (ref 96–106)
CO2: 23 mmol/L (ref 20–29)
CREATININE: 0.94 mg/dL (ref 0.57–1.00)
Calcium: 9.4 mg/dL (ref 8.7–10.2)
GFR calc non Af Amer: 78 mL/min/{1.73_m2} (ref >59)
GFR, EST AFRICAN AMERICAN: 90 mL/min/{1.73_m2} (ref >59)
GLOBULIN, TOTAL: 2.4 g/dL (ref 1.5–4.5)
GLUCOSE: 81 mg/dL (ref 65–99)
Potassium: 4.2 mmol/L (ref 3.5–5.2)
Sodium: 140 mmol/L (ref 134–144)
TOTAL PROTEIN: 6.9 g/dL (ref 6.0–8.5)

## 2017-12-20 NOTE — Telephone Encounter (Signed)
-----   Message from Butch PennyMegan Millikan, NP sent at 12/20/2017  8:18 AM EDT ----- Lab work unremarkable please call patient

## 2017-12-20 NOTE — Telephone Encounter (Signed)
I contacted the patient and advised via voicemail of lab results (ok Per DPR). Requested a call back if the patient would like to discuss further.  MB RN

## 2018-01-09 NOTE — Progress Notes (Signed)
I agree with the assessment and plan as directed by NP .The patient is known to me .   Shellyann Wandrey, MD  

## 2018-01-22 ENCOUNTER — Telehealth: Payer: Self-pay | Admitting: Adult Health

## 2018-01-22 MED ORDER — AMPHETAMINE-DEXTROAMPHETAMINE 20 MG PO TABS: ORAL_TABLET | ORAL | 0 refills | Status: DC

## 2018-01-22 NOTE — Telephone Encounter (Signed)
Pt requesting refills for amphetamine-dextroamphetamine (ADDERALL) 20 MG tablet sent to [facility] 

## 2018-01-22 NOTE — Addendum Note (Signed)
Addended by: Enedina FinnerMILLIKAN, Lenni Reckner P on: 01/22/2018 05:10 PM   Modules accepted: Orders

## 2018-02-20 ENCOUNTER — Other Ambulatory Visit: Payer: Self-pay | Admitting: Adult Health

## 2018-02-20 MED ORDER — AMPHETAMINE-DEXTROAMPHETAMINE 20 MG PO TABS: ORAL_TABLET | ORAL | 0 refills | Status: DC

## 2018-02-20 NOTE — Telephone Encounter (Signed)
Pt requesting refills for amphetamine-dextroamphetamine (ADDERALL) 20 MG tablet sent to [facility] 

## 2018-02-20 NOTE — Telephone Encounter (Addendum)
Pt is due for a refil on adderall. [place] Drug Registry checked. Pt is up to date on her appts. Will send to [facility], NP for review and approval.

## 2018-02-26 ENCOUNTER — Other Ambulatory Visit: Payer: Self-pay | Admitting: Neurology

## 2018-02-26 DIAGNOSIS — G4710 Hypersomnia, unspecified: Secondary | ICD-10-CM

## 2018-02-26 DIAGNOSIS — G47419 Narcolepsy without cataplexy: Secondary | ICD-10-CM

## 2018-02-26 MED ORDER — SODIUM OXYBATE 500 MG/ML PO SOLN: 4500.0000 mg | Freq: Every day | ORAL | 5 refills | Status: DC

## 2018-04-08 ENCOUNTER — Other Ambulatory Visit: Payer: Self-pay | Admitting: Neurology

## 2018-04-08 ENCOUNTER — Telehealth: Payer: Self-pay | Admitting: Neurology

## 2018-04-08 NOTE — Telephone Encounter (Signed)
I have routed this request to Dr Dohmeier for review. The pt is due for the medication and [place] registry was verified.  

## 2018-04-08 NOTE — Telephone Encounter (Signed)
Patient needs a refill on her amphetamine-dextroamphetamine (ADDERALL) 20 MG tablet . Patient states she has two days left. Thanks Annabelle Harmanana.

## 2018-04-09 MED ORDER — AMPHETAMINE-DEXTROAMPHETAMINE 20 MG PO TABS: ORAL_TABLET | ORAL | 0 refills | Status: DC

## 2018-05-15 ENCOUNTER — Other Ambulatory Visit: Payer: Self-pay | Admitting: Adult Health

## 2018-05-15 NOTE — Telephone Encounter (Signed)
Pt has called for a refill on  Her amphetamine-dextroamphetamine (ADDERALL) 20 MG tablet [facility] [phone]

## 2018-05-15 NOTE — Addendum Note (Signed)
Addended by: [place] Pink on: 05/15/2018 01:41 PM   Modules accepted: Orders

## 2018-05-16 MED ORDER — AMPHETAMINE-DEXTROAMPHETAMINE 20 MG PO TABS: ORAL_TABLET | ORAL | 0 refills | Status: DC

## 2018-05-20 ENCOUNTER — Telehealth: Payer: Self-pay | Admitting: *Deleted

## 2018-05-20 ENCOUNTER — Encounter: Payer: Self-pay | Admitting: *Deleted

## 2018-05-20 NOTE — Telephone Encounter (Addendum)
Received fax from [employer] re: brand  adderall approved x 12 months, until 05/21/19.  PA [id number].  Faxed approval letter to [facility] and sent patient my chart to let her know.

## 2018-05-20 NOTE — Telephone Encounter (Signed)
Called [facility], spoke with Harriett Sine, answered questions for PA. Decision will be faxed within 24-72 hours. Called patient and made her aware. Patient verbalized understanding, appreciation.

## 2018-05-20 NOTE — Telephone Encounter (Signed)
Pt states that he insurance is needing PA for her  amphetamine-dextroamphetamine (ADDERALL) 20 MG tablet  [facility] [address].

## 2018-05-21 ENCOUNTER — Other Ambulatory Visit: Payer: Self-pay | Admitting: Neurology

## 2018-05-27 ENCOUNTER — Encounter: Payer: Self-pay | Admitting: Neurology

## 2018-06-20 ENCOUNTER — Telehealth: Payer: Self-pay | Admitting: Neurology

## 2018-06-20 NOTE — Telephone Encounter (Signed)
PA submitted through cover my meds/[facility].  GLO:[id number]. Should hear a response in 72 hrs.

## 2018-06-20 NOTE — Telephone Encounter (Signed)
PA approved until 06/21/2019 through [facility]  [id number]

## 2018-06-25 ENCOUNTER — Ambulatory Visit: Payer: 59 | Admitting: Neurology

## 2018-06-26 ENCOUNTER — Ambulatory Visit: Payer: 59 | Admitting: Neurology

## 2018-06-26 ENCOUNTER — Encounter: Payer: Self-pay | Admitting: Neurology

## 2018-06-26 VITALS — BP 120/75 | HR 88 | Ht 63.0 in | Wt 126.0 lb

## 2018-06-26 DIAGNOSIS — G47419 Narcolepsy without cataplexy: Secondary | ICD-10-CM | POA: Diagnosis not present

## 2018-06-26 DIAGNOSIS — G4710 Hypersomnia, unspecified: Secondary | ICD-10-CM | POA: Diagnosis not present

## 2018-06-26 DIAGNOSIS — Z79899 Other long term (current) drug therapy: Secondary | ICD-10-CM

## 2018-06-26 MED ORDER — SODIUM OXYBATE 500 MG/ML PO SOLN: 4500.0000 mg | Freq: Every day | ORAL | 5 refills | Status: DC

## 2018-06-26 MED ORDER — AMPHETAMINE-DEXTROAMPHETAMINE 20 MG PO TABS: ORAL_TABLET | ORAL | 0 refills | Status: DC

## 2018-06-26 NOTE — Progress Notes (Signed)
PATIENT: Kourtnei Kreyling DOB: 04-Jan-1981  REASON FOR VISIT: follow up HISTORY FROM: patient  HISTORY OF PRESENT ILLNESS: Today 06/26/18: I have the pleasure of meeting today with Terence Lux a patient for well over 10 years at [facility], soon 38 years of age, treated with Xyrem and Adderall for narcolepsy.  The patient does not have a return of excessive daytime sleepiness while on these medications, which she tolerates well.  Epworth sleepiness score was endorsed at 5 points and she has no known side effects.  She denies any depression or suicidal thoughts.  There is no substance use aside from the prescription medication.  Ms. Fiume is a 38 year old female with a history of narcolepsy.  She returns today for follow-up.  She continues on Xyrem and Adderall.  She reports that this medication continues to work well for her.  She has a full-time job with no difficulty.  She drives without difficulty.  Denies any worsening of depression.  Denies suicidal thoughts.  She returns today for evaluation.   HISTORY 06/19/17  Ms. Bartolomucci is a 38 year old female with a history of narcolepsy.  She returns today for follow-up.  She remains on Xyrem and Adderall.  She reports that the medication works well for her.  She denies falling asleep driving or at work.  She denies any worsening depression.  She reports that her primary care is retiring so she is curious if we will take over her Wellbutrin, Cymbalta and Lamictal.  She returns today for an evaluation.   REVIEW OF SYSTEMS: Out of a complete 14 system review of symptoms, the patient complains only of the following symptoms, and all other reviewed systems are negative.  See HPI  ALLERGIES: Allergies  Allergen Reactions  . Bee Venom     HOME MEDICATIONS: Outpatient Medications Prior to Visit  Medication Sig Dispense Refill  . amphetamine-dextroamphetamine (ADDERALL) 20 MG tablet Take one tablet in AM, one tablet at 12  noon, and one half tablet at 4 pm. 75 tablet 0  . buPROPion (WELLBUTRIN XL) 150 MG 24 hr tablet 450 mg.   5  . DULoxetine HCl 40 MG CPEP 40 mg.     . lamoTRIgine (LAMICTAL) 100 MG tablet TAKE 1 TABLET(100 MG) BY MOUTH DAILY 30 tablet 1  . levonorgestrel (MIRENA) 20 MCG/24HR IUD 1 each by Intrauterine route once.    . loratadine (CLARITIN) 10 MG tablet Take 10 mg by mouth daily.    . Sodium Oxybate (XYREM) 500 MG/ML SOLN Take 9 mLs (4,500 mg total) by mouth daily. Take twice at night 4.5 gram , 4500 mg or 9 ml. . 3 Bottle 5  . acyclovir (ZOVIRAX) 400 MG tablet   10  . EPIDUO 0.1-2.5 % gel   11   No facility-administered medications prior to visit.     PAST MEDICAL HISTORY: Past Medical History:  Diagnosis Date  . Controlled narcolepsy 05/27/2013  . Depression   . EDS (Ehlers-Danlos syndrome)    persistent excessive daytime sleepiness  . Hypersomnia, persistent    MSLT no SREM but lass than 8 minutes SL.   [name] PTSD (post-traumatic stress disorder)   . Recovering alcoholic (HCC)     PAST SURGICAL HISTORY: Past Surgical History:  Procedure Laterality Date  . LASIK Right 6/12   good success- vision correction    FAMILY HISTORY: Family History  Problem Relation Age of Onset  . Parkinson's disease Maternal Grandmother   . Drug abuse Maternal Grandfather   . Cancer  Paternal Grandmother   . Cancer Paternal Grandfather   . Drug abuse Paternal Grandfather     SOCIAL HISTORY: Social History   Socioeconomic History  . Marital status: Single    Spouse name: Not on file  . Number of children: 0  . Years of education: Bachelors  . Highest education level: Not on file  Occupational History    Employer: [employer]  Social Needs  . Financial resource strain: Not on file  . Food insecurity:    Worry: Not on file    Inability: Not on file  . Transportation needs:    Medical: Not on file    Non-medical: Not on file  Tobacco Use  . Smoking status: Former Games developermoker  . Smokeless  tobacco: Never Used  . Tobacco comment: quit smoking 3/10  Substance and Sexual Activity  . Alcohol use: No    Comment: quit alcohol 02/2003-recovering alcohol abuser  . Drug use: No  . Sexual activity: Not on file  Lifestyle  . Physical activity:    Days per week: Not on file    Minutes per session: Not on file  . Stress: Not on file  Relationships  . Social connections:    Talks on phone: Not on file    Gets together: Not on file    Attends religious service: Not on file    Active member of club or organization: Not on file    Attends meetings of clubs or organizations: Not on file    Relationship status: Not on file  . Intimate partner violence:    Fear of current or ex partner: Not on file    Emotionally abused: Not on file    Physically abused: Not on file    Forced sexual activity: Not on file  Other Topics Concern  . Not on file  Social History Narrative       Patient is single, recovering ETOH abuser. Patient has her bachelors degree  Patient has her masters from [employer].. 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    Patient is right-handed.   Patient does not drink any caffeine.               PHYSICAL EXAM  Vitals:   06/26/18 1418  BP: 120/75  Pulse: 88  Weight: 126 lb (57.2 kg)  Height: 5\' 3"  (1.6 m)   Body mass index is 22.32 kg/m.  Generalized: Well developed, in no acute distress   Neurological examination  Mentation: Alert oriented to time, place, history taking. Follows all commands speech and language fluent Cranial nerve : no changes in taste or smell.  Pupils were equally round and reactive to light. Extraocular movements were full,  Facial sensation and strength were normal.  Uvula and  tongue in midline.no tremor.   Head turning and shoulder shrug were symmetric. Motor: normal strength of all 4 extremities and symmetric motor tone is noted throughout.  normal finger-nose movements  bilaterally.  No changes in penmanship.  Gait and  station: Gait is normal. Tandem gait is normal.  Reflexes: Deep tendon reflexes are symmetrically intact .   DIAGNOSTIC DATA (LABS, IMAGING, TESTING) - I reviewed patient records, labs, notes, testing and imaging myself where available.  No results found for: WBC, HGB, HCT, MCV, PLT    Component Value Date/Time   NA 140 12/19/2017 0919   K 4.2 12/19/2017 0919   CL 103 12/19/2017 0919   CO2 23 12/19/2017 0919   GLUCOSE 81 12/19/2017 0919  BUN 8 12/19/2017 0919   CREATININE 0.94 12/19/2017 0919   CALCIUM 9.4 12/19/2017 0919   PROT 6.9 12/19/2017 0919   ALBUMIN 4.5 12/19/2017 0919   AST 11 12/19/2017 0919   ALT 10 12/19/2017 0919   ALKPHOS 55 12/19/2017 0919   BILITOT 0.3 12/19/2017 0919   GFRNONAA 78 12/19/2017 0919   GFRAA 90 12/19/2017 0919    ASSESSMENT AND PLAN 38 y.o. year old female  has a past medical history of Controlled narcolepsy (05/27/2013), Depression, EDS (Ehlers-Danlos syndrome), Hypersomnia, persistent, PTSD (post-traumatic stress disorder), and Recovering alcoholic (HCC). here with:  1.  Narcolepsy without cataplexy  The patient will continue on Adderall and Xyrem. She will need a refill today   I will check blood work today.   She will follow-up in 6 months or sooner if needed, next visit with NP. [name]    I spent 20 minutes with the patient. 50% of this time was spent reviewing her symptoms   Melvyn Novas, MD  06/26/2018, 2:53 PM [facility] [address], [address] [place], [place] [address] [phone]

## 2018-06-26 NOTE — Patient Instructions (Signed)
As discussed, Xyrem has to be taken with very mindful caution: Taking Xyrem correctly is key. This means, take it only when you are fully ready to fall asleep, while in bed and refrain from doing any other activities, even brushing  your teeth after taking your first dose. The second dose will be about 2-1/2-4 hours after his first dose. You can go to the bathroom before your 2nd dose. Take your first dose, when actually IN BED, ready to sleep. No sitting up in bed, NO reading, NO using the cell phone or computer, NO getting up to use the bathroom. Take care of everything BEFORE sleep time. Try NOT to skip the second dose as the Xyrem is not going to stay in your system long enough with only one dose. Do not drink alcohol with Xyrem. If you do drink Alcohol, you cannot take your Xyrem doses that night.   

## 2018-06-27 ENCOUNTER — Encounter: Payer: Self-pay | Admitting: Neurology

## 2018-06-27 ENCOUNTER — Telehealth: Payer: Self-pay | Admitting: Neurology

## 2018-06-27 LAB — COMPREHENSIVE METABOLIC PANEL
ALBUMIN: 4.5 g/dL (ref 3.8–4.8)
ALT: 8 IU/L (ref 0–32)
AST: 10 IU/L (ref 0–40)
Albumin/Globulin Ratio: 2 (ref 1.2–2.2)
Alkaline Phosphatase: 53 IU/L (ref 39–117)
BUN/Creatinine Ratio: 12 (ref 9–23)
BUN: 14 mg/dL (ref 6–20)
Bilirubin Total: <0.2 mg/dL (ref 0.0–1.2)
CALCIUM: 9.7 mg/dL (ref 8.7–10.2)
CHLORIDE: 100 mmol/L (ref 96–106)
CO2: 24 mmol/L (ref 20–29)
Creatinine, Ser: 1.14 mg/dL — ABNORMAL HIGH (ref 0.57–1.00)
GFR, EST AFRICAN AMERICAN: 71 mL/min/{1.73_m2} (ref >59)
GFR, EST NON AFRICAN AMERICAN: 62 mL/min/{1.73_m2} (ref >59)
GLUCOSE: 73 mg/dL (ref 65–99)
Globulin, Total: 2.2 g/dL (ref 1.5–4.5)
POTASSIUM: 4.2 mmol/L (ref 3.5–5.2)
SODIUM: 138 mmol/L (ref 134–144)
Total Protein: 6.7 g/dL (ref 6.0–8.5)

## 2018-06-27 LAB — CBC
Hematocrit: 37 % (ref 34.0–46.6)
Hemoglobin: 12.8 g/dL (ref 11.1–15.9)
MCH: 32.4 pg (ref 26.6–33.0)
MCHC: 34.6 g/dL (ref 31.5–35.7)
MCV: 94 fL (ref 79–97)
PLATELETS: 300 10*3/uL (ref 150–450)
RBC: 3.95 x10E6/uL (ref 3.77–5.28)
RDW: 11.6 % — AB (ref 11.7–15.4)
WBC: 5 10*3/uL (ref 3.4–10.8)

## 2018-06-27 NOTE — Telephone Encounter (Signed)
Called the patient and made her aware that lab work looked good. There was no answer. LVM informing the patient that lab work was good and the only thing of concern was BUN and creatnine level slightly elevated but most likely was due to needing to drink plenty of fluids. Advised there was nothing concerning and pt should continue medications. Advised the patient to call back if she had questions.

## 2018-06-27 NOTE — Telephone Encounter (Signed)
-----   Message from Melvyn Novas, MD sent at 06/27/2018 12:30 PM EST ----- Dehydration related high creatine- see BUN.

## 2018-07-31 ENCOUNTER — Other Ambulatory Visit: Payer: Self-pay | Admitting: Neurology

## 2018-07-31 MED ORDER — AMPHETAMINE-DEXTROAMPHETAMINE 20 MG PO TABS: ORAL_TABLET | ORAL | 0 refills | Status: DC

## 2018-07-31 NOTE — Addendum Note (Signed)
Addended by: Hillis Range on: 07/31/2018 11:29 AM   Modules accepted: Orders

## 2018-07-31 NOTE — Telephone Encounter (Signed)
Pt has called for a refill on her amphetamine-dextroamphetamine (ADDERALL) 20 MG tablet [id number]  °

## 2018-09-03 ENCOUNTER — Other Ambulatory Visit: Payer: Self-pay | Admitting: Neurology

## 2018-09-03 ENCOUNTER — Telehealth: Payer: Self-pay | Admitting: Neurology

## 2018-09-03 MED ORDER — AMPHETAMINE-DEXTROAMPHETAMINE 20 MG PO TABS: ORAL_TABLET | ORAL | 0 refills | Status: DC

## 2018-09-03 MED ORDER — LAMOTRIGINE 100 MG PO TABS: ORAL_TABLET | ORAL | 0 refills | Status: AC

## 2018-09-03 NOTE — Telephone Encounter (Signed)
Patient calling requesting refills of  Lamictal & Adderall .She uses [facility] on [address].  She uses generics on both. Please call her to let her know this is done [phone]

## 2018-09-03 NOTE — Telephone Encounter (Signed)
I have routed this request to Dr Vickey Huger for review. The pt is due for the medication and [place] registry was verified. For future Lamictal needs to be discussed with getting refilled by her PCP. One month supply sent for this month but should discuss PCP taking over

## 2018-10-07 ENCOUNTER — Telehealth: Payer: Self-pay | Admitting: Neurology

## 2018-10-07 ENCOUNTER — Other Ambulatory Visit: Payer: Self-pay | Admitting: Neurology

## 2018-10-07 MED ORDER — AMPHETAMINE-DEXTROAMPHETAMINE 20 MG PO TABS: ORAL_TABLET | ORAL | 0 refills | Status: DC

## 2018-10-07 NOTE — Telephone Encounter (Signed)
Pt called in for a refill of amphetamine-dextroamphetamine (ADDERALL) 20 MG tablet and lamoTRIgine (LAMICTAL) 100 MG tablet to be sent to [id number] - [place], [place] - [address] AT [facility] [address] [facility] [address]

## 2018-10-07 NOTE — Telephone Encounter (Signed)
I have routed this request to Dr Vickey Huger for review. The pt is due for the medication and [place] registry was verified. Adderall was sent,  ' Per previous message lamictal was not sent. I had stated on last refill that in previous apt it was discussed about the patient's PCP taking over this medication for the patient she is stable on it.   If patient calls back please advise that per last office visit and discussion per Dr Vickey Huger it was informed that patient should discuss with PCP about taking over the refills for the lamictal. We can send another 30 day supply until she has a chance to discuss with her PCP but wanted to make sure that attempts

## 2018-11-18 ENCOUNTER — Telehealth: Payer: Self-pay | Admitting: Neurology

## 2018-11-18 ENCOUNTER — Other Ambulatory Visit: Payer: Self-pay | Admitting: Neurology

## 2018-11-18 MED ORDER — AMPHETAMINE-DEXTROAMPHETAMINE 20 MG PO TABS: ORAL_TABLET | ORAL | 0 refills | Status: DC

## 2018-11-18 NOTE — Telephone Encounter (Signed)
I have routed this request to Dr Dohmeier for review. The pt is due for the medication and [place] registry was verified.  

## 2018-11-18 NOTE — Telephone Encounter (Signed)
Patient called requesting a refill on her rx Aderrall. DW

## 2018-11-26 ENCOUNTER — Other Ambulatory Visit: Payer: Self-pay | Admitting: Neurology

## 2018-11-26 DIAGNOSIS — G4710 Hypersomnia, unspecified: Secondary | ICD-10-CM

## 2018-11-26 DIAGNOSIS — G47419 Narcolepsy without cataplexy: Secondary | ICD-10-CM

## 2018-11-26 MED ORDER — XYREM 500 MG/ML PO SOLN: 4500.0000 mg | Freq: Every day | ORAL | 5 refills | Status: DC

## 2018-12-27 ENCOUNTER — Telehealth: Payer: Self-pay | Admitting: Neurology

## 2018-12-27 NOTE — Telephone Encounter (Signed)
Pt is needing a refill on her amphetamine-dextroamphetamine (ADDERALL) 20 MG tablet sent to [facility] on [address]

## 2018-12-30 ENCOUNTER — Other Ambulatory Visit: Payer: Self-pay | Admitting: Neurology

## 2018-12-30 MED ORDER — AMPHETAMINE-DEXTROAMPHETAMINE 20 MG PO TABS: ORAL_TABLET | ORAL | 0 refills | Status: DC

## 2018-12-30 NOTE — Telephone Encounter (Signed)
I have routed this request to Dr Dohmeier for review. The pt is due for the medication and [place] registry was verified.  

## 2019-01-09 ENCOUNTER — Ambulatory Visit: Payer: 59 | Admitting: Adult Health

## 2019-01-09 ENCOUNTER — Encounter: Payer: Self-pay | Admitting: Adult Health

## 2019-01-09 ENCOUNTER — Other Ambulatory Visit: Payer: Self-pay

## 2019-01-09 VITALS — BP 100/69 | HR 93 | Temp 98.4°F | Ht 63.0 in | Wt 125.8 lb

## 2019-01-09 DIAGNOSIS — Z79899 Other long term (current) drug therapy: Secondary | ICD-10-CM

## 2019-01-09 DIAGNOSIS — G47419 Narcolepsy without cataplexy: Secondary | ICD-10-CM | POA: Diagnosis not present

## 2019-01-09 NOTE — Patient Instructions (Signed)
Your Plan:  Continue xyrem and Adderall  If your symptoms worsen or you develop new symptoms please let us know.   Thank you for coming to see [place] at [facility]. I hope we have been able to provide you high quality care today.  You may receive a patient satisfaction survey over the next few weeks. We would appreciate your feedback and comments so that we may continue to improve ourselves and the health of our patients.

## 2019-01-09 NOTE — Progress Notes (Signed)
PATIENT: Sherry Rhodes DOB: 04/14/1981  REASON FOR VISIT: follow up HISTORY FROM: patient  HISTORY OF PRESENT ILLNESS: Today 01/09/19:  Sherry Rhodes is a 38 year old female with a history of narcolepsy.  She returns today for follow-up.  She remains on Xyrem and Adderall.  She reports that these worked well for her.  She denies any significant daytime sleepiness.  She works from home.  Denies any difficulty driving.  Denies any changes in her depression or suicidal thoughts.  She does report that she was recently put on Viibryd.  She states that this is working well for her.  She returns today for evaluation.  HISTORY (Copied from Dr.Dohmeier's note) 06/26/18: I have the pleasure of meeting today with Sherry Rhodes a patient for well over 10 years at [facility], soon 38 years of age, treated with Xyrem and Adderall for narcolepsy.  The patient does not have a return of excessive daytime sleepiness while on these medications, which she tolerates well.  Epworth sleepiness score was endorsed at 5 points and she has no known side effects.  She denies any depression or suicidal thoughts.  There is no substance use aside from the prescription medication.  REVIEW OF SYSTEMS: Out of a complete 14 system review of symptoms, the patient complains only of the following symptoms, and all other reviewed systems are negative.  See HPI  ALLERGIES: Allergies  Allergen Reactions  . Bee Venom     HOME MEDICATIONS: Outpatient Medications Prior to Visit  Medication Sig Dispense Refill  . amphetamine-dextroamphetamine (ADDERALL) 20 MG tablet Take one tablet in AM, one tablet at 12 noon, and one half tablet at 4 pm. 75 tablet 0  . buPROPion (WELLBUTRIN XL) 150 MG 24 hr tablet 450 mg.   5  . DULoxetine HCl 40 MG CPEP 40 mg.     . lamoTRIgine (LAMICTAL) 100 MG tablet TAKE 1 TABLET(100 MG) BY MOUTH DAILY 30 tablet 0  . levonorgestrel (MIRENA) 20 MCG/24HR IUD 1 each by Intrauterine  route once.    . loratadine (CLARITIN) 10 MG tablet Take 10 mg by mouth daily.    . Sodium Oxybate (XYREM) 500 MG/ML SOLN Take 9 mLs (4,500 mg total) by mouth daily. Take twice at night 4.5 gram , 4500 mg or 9 ml. . 540 mL 5  . VIIBRYD 20 MG TABS Take 40 mg by mouth daily.     No facility-administered medications prior to visit.     PAST MEDICAL HISTORY: Past Medical History:  Diagnosis Date  . Controlled narcolepsy 05/27/2013  . Depression   . EDS (Ehlers-Danlos syndrome)    persistent excessive daytime sleepiness  . Hypersomnia, persistent    MSLT no SREM but lass than 8 minutes SL.   [name] PTSD (post-traumatic stress disorder)   . Recovering alcoholic (HCC)     PAST SURGICAL HISTORY: Past Surgical History:  Procedure Laterality Date  . LASIK Right 6/12   good success- vision correction    FAMILY HISTORY: Family History  Problem Relation Age of Onset  . Parkinson's disease Maternal Grandmother   . Drug abuse Maternal Grandfather   . Cancer Paternal Grandmother   . Cancer Paternal Grandfather   . Drug abuse Paternal Grandfather     SOCIAL HISTORY: Social History   Socioeconomic History  . Marital status: Single    Spouse name: Not on file  . Number of children: 0  . Years of education: Bachelors  . Highest education level: Not on file  Occupational  History    Employer: [employer]  Social Needs  . Financial resource strain: Not on file  . Food insecurity    Worry: Not on file    Inability: Not on file  . Transportation needs    Medical: Not on file    Non-medical: Not on file  Tobacco Use  . Smoking status: Former Research scientist (life sciences)  . Smokeless tobacco: Never Used  . Tobacco comment: quit smoking 3/10  Substance and Sexual Activity  . Alcohol use: No    Comment: quit alcohol 02/2003-recovering alcohol abuser  . Drug use: No  . Sexual activity: Not on file  Lifestyle  . Physical activity    Days per week: Not on file    Minutes per session: Not on file  .  Stress: Not on file  Relationships  . Social Herbalist on phone: Not on file    Gets together: Not on file    Attends religious service: Not on file    Active member of club or organization: Not on file    Attends meetings of clubs or organizations: Not on file    Relationship status: Not on file  . Intimate partner violence    Fear of current or ex partner: Not on file    Emotionally abused: Not on file    Physically abused: Not on file    Forced sexual activity: Not on file  Other Topics Concern  . Not on file  Social History Narrative       Patient is single, recovering ETOH abuser. Patient has her bachelors degree  Patient has her masters from [employer].. 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    Patient is right-handed.   Patient does not drink any caffeine.               PHYSICAL EXAM  Vitals:   01/09/19 1353  BP: 100/69  Pulse: 93  Temp: 98.4 F (36.9 C)  TempSrc: Oral  Weight: 125 lb 12.8 oz (57.1 kg)  Height: 5\' 3"  (1.6 m)   Body mass index is 22.28 kg/m.  Generalized: Well developed, in no acute distress   Neurological examination  Mentation: Alert oriented to time, place, history taking. Follows all commands speech and language fluent Cranial nerve II-XII: Pupils were equal round reactive to light. Extraocular movements were full, visual field were full on confrontational test. Head turning and shoulder shrug  were normal and symmetric. Motor: The motor testing reveals 5 over 5 strength of all 4 extremities. Good symmetric motor tone is noted throughout.  Sensory: Sensory testing is intact to soft touch on all 4 extremities. No evidence of extinction is noted.  Gait and station: Gait is normal.    DIAGNOSTIC DATA (LABS, IMAGING, TESTING) - I reviewed patient records, labs, notes, testing and imaging myself where available.  Lab Results  Component Value Date   WBC 5.0 06/26/2018   HGB 12.8 06/26/2018   HCT 37.0 06/26/2018   MCV 94  06/26/2018   PLT 300 06/26/2018      Component Value Date/Time   NA 138 06/26/2018 1503   K 4.2 06/26/2018 1503   CL 100 06/26/2018 1503   CO2 24 06/26/2018 1503   GLUCOSE 73 06/26/2018 1503   BUN 14 06/26/2018 1503   CREATININE 1.14 (H) 06/26/2018 1503   CALCIUM 9.7 06/26/2018 1503   PROT 6.7 06/26/2018 1503   ALBUMIN 4.5 06/26/2018 1503   AST 10 06/26/2018 1503   ALT 8 06/26/2018  1503   ALKPHOS 53 06/26/2018 1503   BILITOT <0.2 06/26/2018 1503   GFRNONAA 62 06/26/2018 1503   GFRAA 71 06/26/2018 1503     ASSESSMENT AND PLAN 38 y.o. year old female  has a past medical history of Controlled narcolepsy (05/27/2013), Depression, EDS (Ehlers-Danlos syndrome), Hypersomnia, persistent, PTSD (post-traumatic stress disorder), and Recovering alcoholic (HCC). here with:  1.  Narcolepsy  Overall the patient is doing well.  She will continue on Xyrem and Adderall.  I will check blood work today.  She is advised that if her symptoms worsen or she develops new symptoms she should let us know.  She will follow-up in 6 months or sooner if needed.  I spent 15 minutes with the patient. 50% of this time was spent reviewing plan of care   Butch PennyMegan Slayden Mennenga, MSN, NP-C 01/09/2019, 2:08 PM [facility] [address], [address] [place], [place] [id number] [phone]

## 2019-01-10 LAB — CBC WITH DIFFERENTIAL/PLATELET
Basophils Absolute: 0.1 10*3/uL (ref 0.0–0.2)
Basos: 1 %
EOS (ABSOLUTE): 0.2 10*3/uL (ref 0.0–0.4)
Eos: 4 %
Hematocrit: 38.6 % (ref 34.0–46.6)
Hemoglobin: 12.9 g/dL (ref 11.1–15.9)
Immature Grans (Abs): 0 10*3/uL (ref 0.0–0.1)
Immature Granulocytes: 0 %
Lymphocytes Absolute: 2.1 10*3/uL (ref 0.7–3.1)
Lymphs: 37 %
MCH: 31.8 pg (ref 26.6–33.0)
MCHC: 33.4 g/dL (ref 31.5–35.7)
MCV: 95 fL (ref 79–97)
Monocytes Absolute: 0.6 10*3/uL (ref 0.1–0.9)
Monocytes: 10 %
Neutrophils Absolute: 2.7 10*3/uL (ref 1.4–7.0)
Neutrophils: 48 %
Platelets: 310 10*3/uL (ref 150–450)
RBC: 4.06 x10E6/uL (ref 3.77–5.28)
RDW: 11.7 % (ref 11.7–15.4)
WBC: 5.7 10*3/uL (ref 3.4–10.8)

## 2019-01-10 LAB — COMPREHENSIVE METABOLIC PANEL
ALT: 9 IU/L (ref 0–32)
AST: 10 IU/L (ref 0–40)
Albumin/Globulin Ratio: 1.8 (ref 1.2–2.2)
Albumin: 4.7 g/dL (ref 3.8–4.8)
Alkaline Phosphatase: 46 IU/L (ref 39–117)
BUN/Creatinine Ratio: 10 (ref 9–23)
BUN: 11 mg/dL (ref 6–20)
Bilirubin Total: 0.2 mg/dL (ref 0.0–1.2)
CO2: 23 mmol/L (ref 20–29)
Calcium: 9.6 mg/dL (ref 8.7–10.2)
Chloride: 103 mmol/L (ref 96–106)
Creatinine, Ser: 1.05 mg/dL — ABNORMAL HIGH (ref 0.57–1.00)
GFR calc Af Amer: 78 mL/min/{1.73_m2} (ref >59)
GFR calc non Af Amer: 68 mL/min/{1.73_m2} (ref >59)
Globulin, Total: 2.6 g/dL (ref 1.5–4.5)
Glucose: 104 mg/dL — ABNORMAL HIGH (ref 65–99)
Potassium: 3.7 mmol/L (ref 3.5–5.2)
Sodium: 140 mmol/L (ref 134–144)
Total Protein: 7.3 g/dL (ref 6.0–8.5)

## 2019-02-04 ENCOUNTER — Other Ambulatory Visit: Payer: Self-pay | Admitting: Adult Health

## 2019-02-04 NOTE — Telephone Encounter (Signed)
Drug registry checked adderall last refill 12-30-18 #75. 1 tablet am, noon and 1/2 tab around 4p.

## 2019-02-04 NOTE — Telephone Encounter (Signed)
Pt is needing a refill on her amphetamine-dextroamphetamine (ADDERALL) 20 MG tablet sent to the [facility] on [address]

## 2019-02-05 MED ORDER — AMPHETAMINE-DEXTROAMPHETAMINE 20 MG PO TABS: ORAL_TABLET | ORAL | 0 refills | Status: DC

## 2019-03-17 ENCOUNTER — Other Ambulatory Visit: Payer: Self-pay | Admitting: Adult Health

## 2019-03-17 MED ORDER — AMPHETAMINE-DEXTROAMPHETAMINE 20 MG PO TABS: ORAL_TABLET | ORAL | 0 refills | Status: DC

## 2019-03-17 NOTE — Telephone Encounter (Signed)
Pt is needing a refill on her amphetamine-dextroamphetamine (ADDERALL) 20 MG tablet sent to the [facility] on [address]

## 2019-03-17 NOTE — Telephone Encounter (Signed)
Drug registry checked Adderall 20mg  #75.  Last fill 02-05-19 #75.

## 2019-04-22 ENCOUNTER — Other Ambulatory Visit: Payer: Self-pay | Admitting: Neurology

## 2019-04-22 NOTE — Telephone Encounter (Signed)
1) Medication(s) Requested (by name): amphetamine-dextroamphetamine (ADDERALL) 20 MG tablet   2) Pharmacy of Choice: [facility] [place], [place] AT [address] [facility]

## 2019-04-23 MED ORDER — AMPHETAMINE-DEXTROAMPHETAMINE 20 MG PO TABS: ORAL_TABLET | ORAL | 0 refills | Status: DC

## 2019-05-07 ENCOUNTER — Other Ambulatory Visit: Payer: Self-pay | Admitting: Neurology

## 2019-05-07 DIAGNOSIS — G47419 Narcolepsy without cataplexy: Secondary | ICD-10-CM

## 2019-05-07 DIAGNOSIS — G4710 Hypersomnia, unspecified: Secondary | ICD-10-CM

## 2019-05-07 MED ORDER — XYREM 500 MG/ML PO SOLN: 4500.0000 mg | Freq: Every day | ORAL | 5 refills | Status: DC

## 2019-05-19 ENCOUNTER — Telehealth: Payer: Self-pay | Admitting: Neurology

## 2019-05-19 NOTE — Telephone Encounter (Signed)
Sherry Rhodes from [facility] called stating that the quantity of the pts prescription is missing for the pt's Sodium Oxybate (XYREM) 500 MG/ML SOLN. Please advise. [phone] Option 3 then option

## 2019-05-19 NOTE — Telephone Encounter (Signed)
I have corrected the script and resent to the pharmacy to update the quantity

## 2019-05-22 ENCOUNTER — Telehealth: Payer: Self-pay | Admitting: Neurology

## 2019-05-22 NOTE — Telephone Encounter (Signed)
PA submitted through cover my meds/optum RX. OIP:[id number] Can take up to 72 hrs before hearing a response

## 2019-05-29 ENCOUNTER — Other Ambulatory Visit: Payer: Self-pay | Admitting: Adult Health

## 2019-05-29 NOTE — Telephone Encounter (Signed)
Pt is requesting a refill of amphetamine-dextroamphetamine (ADDERALL) 20 MG tablet, to be sent to [facility] #[id number] - [place], [place] - [address] AT [facility] OF [address] [facility] [address]

## 2019-06-02 MED ORDER — AMPHETAMINE-DEXTROAMPHETAMINE 20 MG PO TABS: ORAL_TABLET | ORAL | 0 refills | Status: DC

## 2019-06-02 NOTE — Telephone Encounter (Signed)
PA approved for the patient through optum rx. Approved through 05/21/2020.  [id number]

## 2019-07-07 ENCOUNTER — Telehealth: Payer: Self-pay | Admitting: Adult Health

## 2019-07-07 MED ORDER — AMPHETAMINE-DEXTROAMPHETAMINE 20 MG PO TABS: ORAL_TABLET | ORAL | 0 refills | Status: DC

## 2019-07-07 NOTE — Telephone Encounter (Signed)
[place] Database Verified LR: 06-02-2019 Qty: 75 Pending appointment: 07-10-2019

## 2019-07-07 NOTE — Telephone Encounter (Signed)
Pt requesting refills for amphetamine-dextroamphetamine (ADDERALL) 20 MG tablet sent to [facility]

## 2019-07-07 NOTE — Addendum Note (Signed)
Addended by: Enedina Finner on: 07/07/2019 03:31 PM   Modules accepted: Orders

## 2019-07-10 ENCOUNTER — Encounter: Payer: Self-pay | Admitting: Adult Health

## 2019-07-10 ENCOUNTER — Other Ambulatory Visit: Payer: Self-pay

## 2019-07-10 ENCOUNTER — Ambulatory Visit: Payer: 59 | Admitting: Adult Health

## 2019-07-10 VITALS — BP 128/76 | HR 92 | Ht 63.0 in | Wt 130.0 lb

## 2019-07-10 DIAGNOSIS — G47419 Narcolepsy without cataplexy: Secondary | ICD-10-CM

## 2019-07-10 NOTE — Patient Instructions (Signed)
Your Plan:  Continue Adderall and Xyrem  If your symptoms worsen or you develop new symptoms please let us know.    Thank you for coming to see [place] at [facility]. I hope we have been able to provide you high quality care today.  You may receive a patient satisfaction survey over the next few weeks. We would appreciate your feedback and comments so that we may continue to improve ourselves and the health of our patients.

## 2019-07-10 NOTE — Progress Notes (Signed)
PATIENT: Sherry Rhodes DOB: April 29, 1981  REASON FOR VISIT: follow up HISTORY FROM: patient  HISTORY OF PRESENT ILLNESS: Today 07/10/19:  Sherry Rhodes is a 39 year old female with a history of narcolepsy.  She returns today for follow-up.  She remains on Xyrem and Adderall.  Reports that these are working well for her.  Denies any significant change in her depression.  Denies suicidal thoughts.  Denies falling asleep at work or while driving.  HISTORY 01/09/19:  Sherry Rhodes is a 39 year old female with a history of narcolepsy.  She returns today for follow-up.  She remains on Xyrem and Adderall.  She reports that these worked well for her.  She denies any significant daytime sleepiness.  She works from home.  Denies any difficulty driving.  Denies any changes in her depression or suicidal thoughts.  She does report that she was recently put on Viibryd.  She states that this is working well for her.  She returns today for evaluation.  REVIEW OF SYSTEMS: Out of a complete 14 system review of symptoms, the patient complains only of the following symptoms, and all other reviewed systems are negative.  See HPI  ALLERGIES: Allergies  Allergen Reactions  . Bee Venom     HOME MEDICATIONS: Outpatient Medications Prior to Visit  Medication Sig Dispense Refill  . amphetamine-dextroamphetamine (ADDERALL) 20 MG tablet Take one tablet in AM, one tablet at 12 noon, and one half tablet at 4 pm. 75 tablet 0  . buPROPion (WELLBUTRIN XL) 150 MG 24 hr tablet 450 mg.   5  . lamoTRIgine (LAMICTAL) 100 MG tablet TAKE 1 TABLET(100 MG) BY MOUTH DAILY (Patient taking differently: Take 200 mg by mouth daily. TAKE 1 TABLET(100 MG) BY MOUTH DAILY) 30 tablet 0  . levonorgestrel (MIRENA) 20 MCG/24HR IUD 1 each by Intrauterine route once.    . loratadine (CLARITIN) 10 MG tablet Take 10 mg by mouth daily.    . Sodium Oxybate (XYREM) 500 MG/ML SOLN Take 9 mLs (4,500 mg total) by mouth daily. Take twice at night  4.5 gram , 4500 mg or 9 ml. . 540 mL 5  . Vilazodone HCl (VIIBRYD) 40 MG TABS Take 40 mg by mouth daily.    . DULoxetine HCl 40 MG CPEP 40 mg.     . VIIBRYD 20 MG TABS Take 40 mg by mouth daily.     No facility-administered medications prior to visit.    PAST MEDICAL HISTORY: Past Medical History:  Diagnosis Date  . Controlled narcolepsy 05/27/2013  . Depression   . EDS (Ehlers-Danlos syndrome)    persistent excessive daytime sleepiness  . Hypersomnia, persistent    MSLT no SREM but lass than 8 minutes SL.   [name] PTSD (post-traumatic stress disorder)   . Recovering alcoholic ([place])     PAST SURGICAL HISTORY: Past Surgical History:  Procedure Laterality Date  . LASIK Right 6/12   good success- vision correction    FAMILY HISTORY: Family History  Problem Relation Age of Onset  . Parkinson's disease Maternal Grandmother   . Drug abuse Maternal Grandfather   . Cancer Paternal Grandmother   . Cancer Paternal Grandfather   . Drug abuse Paternal Grandfather     SOCIAL HISTORY: Social History   Socioeconomic History  . Marital status: Single    Spouse name: Not on file  . Number of children: 0  . Years of education: Bachelors  . Highest education level: Not on file  Occupational History    Employer: [employer]  Tobacco Use  . Smoking status: Former Games developer  . Smokeless tobacco: Never Used  . Tobacco comment: quit smoking 3/10  Substance and Sexual Activity  . Alcohol use: No    Comment: quit alcohol 02/2003-recovering alcohol abuser  . Drug use: No  . Sexual activity: Not on file  Other Topics Concern  . Not on file  Social History Narrative       Patient is single, recovering ETOH abuser. Patient has her bachelors degree  Patient has her masters from [employer].. 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    Patient is right-handed.   Patient does not drink any caffeine.            Social Determinants of Health   Financial Resource Strain:   . Difficulty of  Paying Living Expenses: Not on file  Food Insecurity:   . Worried About Programme researcher, broadcasting/film/video in the Last Year: Not on file  . Ran Out of Food in the Last Year: Not on file  Transportation Needs:   . Lack of Transportation (Medical): Not on file  . Lack of Transportation (Non-Medical): Not on file  Physical Activity:   . Days of Exercise per Week: Not on file  . Minutes of Exercise per Session: Not on file  Stress:   . Feeling of Stress : Not on file  Social Connections:   . Frequency of Communication with Friends and Family: Not on file  . Frequency of Social Gatherings with Friends and Family: Not on file  . Attends Religious Services: Not on file  . Active Member of Clubs or Organizations: Not on file  . Attends Banker Meetings: Not on file  . Marital Status: Not on file  Intimate Partner Violence:   . Fear of Current or Ex-Partner: Not on file  . Emotionally Abused: Not on file  . Physically Abused: Not on file  . Sexually Abused: Not on file      PHYSICAL EXAM  Vitals:   07/10/19 1334  BP: 128/76  Pulse: 92  Weight: 130 lb (59 kg)  Height: 5\' 3"  (1.6 m)   Body mass index is 23.03 kg/m.  Generalized: Well developed, in no acute distress   Neurological examination  Mentation: Alert oriented to time, place, history taking. Follows all commands speech and language fluent Cranial nerve II-XII: Pupils were equal round reactive to light. Extraocular movements were full, visual field were full on confrontational test. Head turning and shoulder shrug  were normal and symmetric. Motor: The motor testing reveals 5 over 5 strength of all 4 extremities. Good symmetric motor tone is noted throughout.  Sensory: Sensory testing is intact to soft touch on all 4 extremities. No evidence of extinction is noted.  Coordination: Cerebellar testing reveals good finger-nose-finger and heel-to-shin bilaterally. temor noted in left upper extremity with finger nose finger  Gait  and station: Gait is normal. Tandem gait is normal. Romberg is negative. No drift is seen.  Reflexes: Deep tendon reflexes are symmetric and normal bilaterally.   DIAGNOSTIC DATA (LABS, IMAGING, TESTING) - I reviewed patient records, labs, notes, testing and imaging myself where available.  Lab Results  Component Value Date   WBC 5.7 01/09/2019   HGB 12.9 01/09/2019   HCT 38.6 01/09/2019   MCV 95 01/09/2019   PLT 310 01/09/2019      Component Value Date/Time   NA 140 01/09/2019 1418   K 3.7 01/09/2019 1418   CL 103 01/09/2019 1418  CO2 23 01/09/2019 1418   GLUCOSE 104 (H) 01/09/2019 1418   BUN 11 01/09/2019 1418   CREATININE 1.05 (H) 01/09/2019 1418   CALCIUM 9.6 01/09/2019 1418   PROT 7.3 01/09/2019 1418   ALBUMIN 4.7 01/09/2019 1418   AST 10 01/09/2019 1418   ALT 9 01/09/2019 1418   ALKPHOS 46 01/09/2019 1418   BILITOT 0.2 01/09/2019 1418   GFRNONAA 68 01/09/2019 1418   GFRAA 78 01/09/2019 1418      ASSESSMENT AND PLAN 39 y.o. year old female  has a past medical history of Controlled narcolepsy (05/27/2013), Depression, EDS (Ehlers-Danlos syndrome), Hypersomnia, persistent, PTSD (post-traumatic stress disorder), and Recovering alcoholic (HCC). here with:  1.  Narcolepsy  -Continue Adderall and Xyrem. -We will have blood work through her primary care next week -Advised if symptoms worsen or she develops new symptoms she should let us know -Follow-up in 6 months or sooner if needed   I spent 15 minutes of face-to-face and non-face-to-face time with patient.  This included previsit chart review, lab review, electronic health record documentation, patient education.  Butch Penny, MSN, NP-C 07/10/2019, 1:41 PM [facility] [address], [address] [place], [place] [address] [phone]

## 2019-08-14 ENCOUNTER — Other Ambulatory Visit: Payer: Self-pay | Admitting: Adult Health

## 2019-08-14 MED ORDER — AMPHETAMINE-DEXTROAMPHETAMINE 20 MG PO TABS: ORAL_TABLET | ORAL | 0 refills | Status: DC

## 2019-08-14 NOTE — Addendum Note (Signed)
Addended by: [place] Pink on: 08/14/2019 02:16 PM   Modules accepted: Orders

## 2019-08-14 NOTE — Telephone Encounter (Signed)
Pt has called for a refill on her amphetamine-dextroamphetamine (ADDERALL) 20 MG tablet [id number]  °

## 2019-09-17 ENCOUNTER — Other Ambulatory Visit: Payer: Self-pay | Admitting: Adult Health

## 2019-09-17 MED ORDER — AMPHETAMINE-DEXTROAMPHETAMINE 20 MG PO TABS: ORAL_TABLET | ORAL | 0 refills | Status: DC

## 2019-09-17 NOTE — Telephone Encounter (Signed)
1) Medication(s) Requested (by name): amphetamine-dextroamphetamine (ADDERALL) 20 MG tablet   2) Pharmacy of Choice:  [facility] #[id number] [place], [place] - [address] AT [facility] OF [facility] [address] [facility] [address]  [address], [place] [id number]

## 2019-10-27 ENCOUNTER — Other Ambulatory Visit: Payer: Self-pay | Admitting: Adult Health

## 2019-10-27 NOTE — Telephone Encounter (Signed)
Pt called needing a refill on her amphetamine-dextroamphetamine (ADDERALL) 20 MG tablet sent in to the [facility] on [address] 

## 2019-10-28 MED ORDER — AMPHETAMINE-DEXTROAMPHETAMINE 20 MG PO TABS: ORAL_TABLET | ORAL | 0 refills | Status: DC

## 2019-11-05 ENCOUNTER — Other Ambulatory Visit: Payer: Self-pay | Admitting: Neurology

## 2019-11-05 DIAGNOSIS — G4710 Hypersomnia, unspecified: Secondary | ICD-10-CM

## 2019-11-05 DIAGNOSIS — G47419 Narcolepsy without cataplexy: Secondary | ICD-10-CM

## 2019-11-05 MED ORDER — XYREM 500 MG/ML PO SOLN: 4500.0000 mg | Freq: Every day | ORAL | 5 refills | Status: DC

## 2019-12-01 ENCOUNTER — Other Ambulatory Visit: Payer: Self-pay | Admitting: Adult Health

## 2019-12-01 MED ORDER — AMPHETAMINE-DEXTROAMPHETAMINE 20 MG PO TABS: ORAL_TABLET | ORAL | 0 refills | Status: DC

## 2019-12-01 NOTE — Telephone Encounter (Signed)
Pt is requesting a refill for amphetamine-dextroamphetamine (ADDERALL) 20 MG tablet  Pharmacy: [id number]  

## 2019-12-01 NOTE — Addendum Note (Signed)
Addended by: Guy Begin on: 12/01/2019 11:51 AM   Modules accepted: Orders

## 2020-01-13 ENCOUNTER — Telehealth: Payer: Self-pay | Admitting: Neurology

## 2020-01-13 MED ORDER — AMPHETAMINE-DEXTROAMPHETAMINE 20 MG PO TABS: ORAL_TABLET | ORAL | 0 refills | Status: DC

## 2020-01-13 NOTE — Telephone Encounter (Signed)
Refilled Adderall - request came on a holiday weekend and was only possible this AM. CD

## 2020-01-14 ENCOUNTER — Encounter: Payer: Self-pay | Admitting: Adult Health

## 2020-01-19 ENCOUNTER — Ambulatory Visit: Payer: 59 | Admitting: Adult Health

## 2020-02-10 ENCOUNTER — Ambulatory Visit (INDEPENDENT_AMBULATORY_CARE_PROVIDER_SITE_OTHER): Payer: 59 | Admitting: Neurology

## 2020-02-10 ENCOUNTER — Encounter: Payer: Self-pay | Admitting: Neurology

## 2020-02-10 ENCOUNTER — Other Ambulatory Visit: Payer: Self-pay

## 2020-02-10 VITALS — BP 119/78 | HR 73 | Ht 63.0 in | Wt 138.0 lb

## 2020-02-10 DIAGNOSIS — G4710 Hypersomnia, unspecified: Secondary | ICD-10-CM

## 2020-02-10 DIAGNOSIS — Z79899 Other long term (current) drug therapy: Secondary | ICD-10-CM | POA: Diagnosis not present

## 2020-02-10 DIAGNOSIS — G47419 Narcolepsy without cataplexy: Secondary | ICD-10-CM | POA: Diagnosis not present

## 2020-02-10 MED ORDER — XYWAV 500 MG/ML PO SOLN: 4.5000 g | Freq: Two times a day (BID) | ORAL | 5 refills | Status: DC

## 2020-02-10 MED ORDER — AMPHETAMINE-DEXTROAMPHETAMINE 20 MG PO TABS: ORAL_TABLET | ORAL | 0 refills | Status: DC

## 2020-02-10 NOTE — Progress Notes (Signed)
PATIENT: Sherry Rhodes DOB: [birth year]  REASON FOR VISIT: follow up HISTORY FROM: patient  HISTORY OF PRESENT ILLNESS: Revisit on  02/10/20: I have the pleasure of meeting today with Sherry Rhodes a patient for well over 13 years at [facility], soon 39 years of age, treated with Xyrem and Adderall for narcolepsy .  The patient does not have a return of excessive daytime sleepiness while on these medications, which she tolerates well.  Epworth sleepiness score was endorsed at 5 points and she has no known side effects.  She denies any depression or suicidal thoughts.  There is no substance use aside from the prescription medication. She is fully vaccinated , and intends to visit [place] next may. No medical interval history.labs today.  .      Sherry Rhodes is a 39 year old female with a history of narcolepsy.  She returns today for follow-up.  She continues on Xyrem and Adderall.  She reports that this medication continues to work well for her.  She has a full-time job with no difficulty.  She drives without difficulty.  Denies any worsening of depression.  Denies suicidal thoughts.  She returns today for evaluation.   HISTORY 06/19/17 MM Sherry Rhodes is a 39 year old female with a history of narcolepsy.  She returns today for follow-up.  She remains on Xyrem and Adderall.  She reports that the medication works well for her.  She denies falling asleep driving or at work.  She denies any worsening depression.  She reports that her primary care is retiring so she is curious if we will take over her Wellbutrin, Cymbalta and Lamictal.   She returns today for an evaluation.   REVIEW OF SYSTEMS: Out of a complete 14 system review of symptoms, the patient complains only of the following symptoms, and all other reviewed systems are negative.  How likely are you to doze in the following situations: 0 = not likely, 1 = slight chance, 2 = moderate chance, 3 = high  chance  Sitting and Reading? Watching Television? Sitting inactive in a public place (theater or meeting)? Lying down in the afternoon when circumstances permit? Sitting and talking to someone? Sitting quietly after lunch without alcohol? In a car, while stopped for a few minutes in traffic? As a passenger in a car for an hour without a break?  Total = 3/24 points      ALLERGIES: Allergies  Allergen Reactions  . Bee Venom     HOME MEDICATIONS: Outpatient Medications Prior to Visit  Medication Sig Dispense Refill  . amphetamine-dextroamphetamine (ADDERALL) 20 MG tablet Take one tablet in AM, one tablet at 12 noon, and one half tablet at 4 pm. 75 tablet 0  . buPROPion (WELLBUTRIN XL) 150 MG 24 hr tablet 450 mg.   5  . lamoTRIgine (LAMICTAL) 100 MG tablet TAKE 1 TABLET(100 MG) BY MOUTH DAILY (Patient taking differently: Take 200 mg by mouth daily. TAKE 1 TABLET(100 MG) BY MOUTH DAILY) 30 tablet 0  . levonorgestrel (MIRENA) 20 MCG/24HR IUD 1 each by Intrauterine route once.    . loratadine (CLARITIN) 10 MG tablet Take 10 mg by mouth daily.    . Sodium Oxybate (XYREM) 500 MG/ML SOLN Take 9 mLs (4,500 mg total) by mouth daily. Take twice at night 4.5 gram , 4500 mg or 9 ml. . 540 mL 5  . Vilazodone HCl (VIIBRYD) 40 MG TABS Take 40 mg by mouth daily.     No facility-administered medications prior to visit.  PAST MEDICAL HISTORY: Past Medical History:  Diagnosis Date  . Controlled narcolepsy 05/27/2013  . Depression   . EDS (Ehlers-Danlos syndrome)    persistent excessive daytime sleepiness  . Hypersomnia, persistent    MSLT no SREM but lass than 8 minutes SL.   [name] PTSD (post-traumatic stress disorder)   . Recovering alcoholic (HCC)     PAST SURGICAL HISTORY: Past Surgical History:  Procedure Laterality Date  . LASIK Right 6/12   good success- vision correction    FAMILY HISTORY: Family History  Problem Relation Age of Onset  . Parkinson's disease Maternal  Grandmother   . Drug abuse Maternal Grandfather   . Cancer Paternal Grandmother   . Cancer Paternal Grandfather   . Drug abuse Paternal Grandfather     SOCIAL HISTORY: Social History   Socioeconomic History  . Marital status: Single    Spouse name: Not on file  . Number of children: 0  . Years of education: Bachelors  . Highest education level: Not on file  Occupational History    Employer: [employer]  Tobacco Use  . Smoking status: Former Games developer  . Smokeless tobacco: Never Used  . Tobacco comment: quit smoking 3/10  Substance and Sexual Activity  . Alcohol use: No    Comment: quit alcohol 02/2003-recovering alcohol abuser  . Drug use: No  . Sexual activity: Not on file  Other Topics Concern  . Not on file  Social History Narrative       Patient is single, recovering ETOH abuser. Patient has her bachelors degree  Patient has her masters from [employer].. 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    Patient is right-handed.   Patient does not drink any caffeine.            Social Determinants of Health   Financial Resource Strain:   . Difficulty of Paying Living Expenses: Not on file  Food Insecurity:   . Worried About Programme researcher, broadcasting/film/video in the Last Year: Not on file  . Ran Out of Food in the Last Year: Not on file  Transportation Needs:   . Lack of Transportation (Medical): Not on file  . Lack of Transportation (Non-Medical): Not on file  Physical Activity:   . Days of Exercise per Week: Not on file  . Minutes of Exercise per Session: Not on file  Stress:   . Feeling of Stress : Not on file  Social Connections:   . Frequency of Communication with Friends and Family: Not on file  . Frequency of Social Gatherings with Friends and Family: Not on file  . Attends Religious Services: Not on file  . Active Member of Clubs or Organizations: Not on file  . Attends Banker Meetings: Not on file  . Marital Status: Not on file  Intimate Partner Violence:    . Fear of Current or Ex-Partner: Not on file  . Emotionally Abused: Not on file  . Physically Abused: Not on file  . Sexually Abused: Not on file      PHYSICAL EXAM  Vitals:   02/10/20 0824  BP: 119/78  Pulse: 73  Weight: 138 lb (62.6 kg)  Height: 5\' 3"  (1.6 m)   Body mass index is 24.45 kg/m.  Generalized: Well developed, in no acute distress   Neurological examination  Mentation: Alert oriented to time, place, history taking. Follows all commands speech and language fluent Cranial nerve : no changes in taste or smell.  Pupils were equally round and  reactive to light. Extraocular movements were full,  Facial sensation and strength were normal.  Uvula and  tongue in midline.no tremor.   Head turning and shoulder shrug were symmetric. Motor: normal strength of all 4 extremities and symmetric motor tone is noted throughout.  normal finger-nose movements  bilaterally.  No changes in penmanship.  Gait and station: Gait is normal. Tandem gait is normal.  Reflexes: Deep tendon reflexes are symmetrically intact .   DIAGNOSTIC DATA (LABS, IMAGING, TESTING) - I reviewed patient records, labs, notes, testing and imaging myself where available.  Lab Results  Component Value Date   WBC 5.7 01/09/2019   HGB 12.9 01/09/2019   HCT 38.6 01/09/2019   MCV 95 01/09/2019   PLT 310 01/09/2019      Component Value Date/Time   NA 140 01/09/2019 1418   K 3.7 01/09/2019 1418   CL 103 01/09/2019 1418   CO2 23 01/09/2019 1418   GLUCOSE 104 (H) 01/09/2019 1418   BUN 11 01/09/2019 1418   CREATININE 1.05 (H) 01/09/2019 1418   CALCIUM 9.6 01/09/2019 1418   PROT 7.3 01/09/2019 1418   ALBUMIN 4.7 01/09/2019 1418   AST 10 01/09/2019 1418   ALT 9 01/09/2019 1418   ALKPHOS 46 01/09/2019 1418   BILITOT 0.2 01/09/2019 1418   GFRNONAA 68 01/09/2019 1418   GFRAA 78 01/09/2019 1418    ASSESSMENT AND PLAN 39 y.o. year old female  has a past medical history of Controlled narcolepsy  (05/27/2013), Depression, EDS (Ehlers-Danlos syndrome), Hypersomnia, persistent, PTSD (post-traumatic stress disorder), and alcohol use history (HCC). here with:  1.  Narcolepsy without cataplexy- controlled Hypersomnia, Epworth was 3 points (!)>   The patient will continue on Adderall and changed to [facility]- . She will need a refill today  450 ml, taking 4.5 g each night twice.   I will check blood work today.   She will follow-up in 6 months or sooner if needed, next visit with NP.   I spent 20 minutes with the patient. 50% of this time was spent reviewing her symptoms   Melvyn Novas, MD  02/10/2020, 8:44 AM [facility] [address], [address] [place], [place] [address] [phone]

## 2020-02-10 NOTE — Patient Instructions (Signed)
As discussed, Xyrem/ Trudee Kuster  has to be taken with very mindful caution: Taking Xyrem/ Xywav correctly is key. This means, take it only when you are fully ready to fall asleep, while in bed and refrain from doing any other activities, even brushing  your teeth after taking your first dose. The second dose will be about 2-1/2-4 hours after his first dose. You can go to the bathroom before your 2nd dose. Take your first dose, when actually IN BED, ready to sleep. No sitting up in bed, NO reading, NO using the cell phone or computer, NO getting up to use the bathroom. Take care of everything BEFORE sleep time. Try NOT to skip the second dose as the Xyrem is not going to stay in your system long enough with only one dose. Do not drink alcohol with Xyrem. If you do drink Alcohol, you cannot take your Xyrem doses that night.     Calcium, Magnesium, Potassium, Sodium Oxybates oral solution What is this medicine? CALCIUM, MAGNESIUM, POTASSIUM, SODIUM OXYBATES (KAL see um, mag NEE zee um, poe TASS i um, SOE dee um OX i bates) is used to treat excessive sleepiness and cataplexy in patients with narcolepsy. Cataplexy causes a sudden muscle weakness due to a strong emotional response. This medicine may be used for other purposes; ask your health care provider or pharmacist if you have questions. COMMON BRAND NAME(S): Trudee Kuster What should I tell my health care provider before I take this medicine? They need to know if you have any of these conditions:  depression  history of drug or alcohol abuse problem  if you drink alcohol  liver disease  lung or breathing disease, like sleep apnea  mental illness  succinic semialdehyde dehydrogenase deficiency  suicidal thoughts, plans, or attempt; a previous suicide attempt by you or a family member  an unusual or allergic reaction to oxybate, other medicines, foods, dyes, or preservatives  pregnant or trying to get pregnant  breast-feeding How should I use  this medicine? Take this medicine by mouth. Follow the directions on the prescription label. Use a specially marked oral syringe, spoon, or dropper to measure each dose. Ask your pharmacist if you do not have one. Household spoons are not accurate. Mix the dose with water as directed. Take this medicine on an empty stomach, or at least 2 hours after food. Take your medicine at regular intervals. Do not take it more often than directed. Do not stop taking except on your doctor's advice. A special MedGuide will be given to you by the pharmacist with each prescription and refill. Be sure to read this information carefully each time. Talk to your pediatrician regarding the use of this medicine in children. While this drug may be prescribed for children as young as 7 years for selected conditions, precautions do apply. Overdosage: If you think you have taken too much of this medicine contact a poison control center or emergency room at once. NOTE: This medicine is only for you. Do not share this medicine with others. What if I miss a dose? If you miss a dose, skip it. Take your next dose at the normal time. Do not take extra or 2 doses at the same time to make up for the missed dose. What may interact with this medicine? Do not take this medicine with any of the following medications:  alcohol  medicines for sleep This medicine may also interact with the following medications:  antihistamines for allergy, cough, and cold  certain medicines  for depression, like amitriptyline, fluoxetine, sertraline  certain medicines for seizures like phenobarbital, primidone  divalproex sodium  general anesthetics like halothane, isoflurane, methoxyflurane, propofol  medicines that relax muscles for surgery  narcotic medicines for pain  phenothiazines like chlorpromazine, mesoridazine, prochlorperazine, thioridazine  valproate or valproic acid This list may not describe all possible interactions. Give  your health care provider a list of all the medicines, herbs, non-prescription drugs, or dietary supplements you use. Also tell them if you smoke, drink alcohol, or use illegal drugs. Some items may interact with your medicine. What should I watch for while using this medicine? Visit your doctor or health care professional for regular checks on your progress. Tell your healthcare professional if your symptoms do not start to get better or if they get worse. This medicine has a risk of abuse and dependence. Your health care provider will check you for this while you take this medicine. You may get drowsy or dizzy. This medicine causes sleep very quickly. You should only take your first dose at bedtime, while in bed. The second dose should be taken 2.5 to 4 hours after your first dose. Do not drive, use machinery, or do anything that needs mental alertness for at least 6 hours after taking this drug. Do not stand up or sit up quickly, especially if you are an older patient. This reduces the risk of dizzy or fainting spells. Alcohol may interfere with the effect of this medicine. Avoid alcoholic drinks. After taking this medicine, you may get up out of bed and do an activity that you do not know you are doing. The next morning, you may have no memory of this. Activities include driving a car ("sleep-driving"), making and eating food, talking on the phone, sexual activity, and sleep-walking. Serious injuries have occurred. Call your doctor right away if you find out you have done any of these activities. Do not take this medicine if you have used alcohol that evening. Do not take it if you have taken another medicine for sleep. The risk of doing these sleep-related activities is higher. If you or your family notice any changes in your behavior, such as new or worsening depression, thoughts of harming yourself, anxiety, other unusual or disturbing thoughts, or memory loss, call your healthcare professional right  away. What side effects may I notice from receiving this medicine? Side effects that you should report to your doctor or health care professional as soon as possible:  allergic reactions like skin rash, itching or hives, swelling of the face, lips, or tongue  anxiety  breathing problems  confusion  hallucinations  seizures  signs and symptoms of low blood pressure like dizziness; feeling faint or lightheaded, falls; unusually weak or tired  sleepwalking  suicidal thoughts, mood changes  vomiting Side effects that usually do not require medical attention (report these to your doctor or health care professional if they continue or are bothersome):  bedwetting  dizziness  drowsiness  headache  loss of appetite  nausea This list may not describe all possible side effects. Call your doctor for medical advice about side effects. You may report side effects to FDA at 1-800-FDA-1088. Where should I keep my medicine? Keep out of the reach of children. This medicine can be abused. Keep your medicine in a safe place to protect it from theft. Do not share this medicine with anyone. Selling or giving away this medicine is dangerous and against the law. Store at room temperature between 15 and 30 degrees  C (59 and 86 degrees F). Keep this medicine in the original container. Throw away any unused medicine after the expiration date. This medicine may cause accidental overdose and death if it is taken by other adults, children, or pets. Flush any unused medicine down the toilet or empty down the sink drain to reduce the chance of harm. Do not use the medicine after the expiration date. After preparing a dose of this medicine with water, the medicine should be taken within 24 hours or emptied down the sink drain. NOTE: This sheet is a summary. It may not cover all possible information. If you have questions about this medicine, talk to your doctor, pharmacist, or health care provider.  2020  Elsevier/Gold Standard (2018-12-07 15:40:37)

## 2020-02-11 ENCOUNTER — Encounter: Payer: Self-pay | Admitting: Neurology

## 2020-02-11 LAB — COMPREHENSIVE METABOLIC PANEL
ALT: 18 IU/L (ref 0–32)
AST: 13 IU/L (ref 0–40)
Albumin/Globulin Ratio: 1.7 (ref 1.2–2.2)
Albumin: 4.5 g/dL (ref 3.8–4.8)
Alkaline Phosphatase: 52 IU/L (ref 44–121)
BUN/Creatinine Ratio: 16 (ref 9–23)
BUN: 16 mg/dL (ref 6–20)
Bilirubin Total: 0.2 mg/dL (ref 0.0–1.2)
CO2: 26 mmol/L (ref 20–29)
Calcium: 9.9 mg/dL (ref 8.7–10.2)
Chloride: 101 mmol/L (ref 96–106)
Creatinine, Ser: 0.99 mg/dL (ref 0.57–1.00)
GFR calc Af Amer: 83 mL/min/{1.73_m2} (ref >59)
GFR calc non Af Amer: 72 mL/min/{1.73_m2} (ref >59)
Globulin, Total: 2.6 g/dL (ref 1.5–4.5)
Glucose: 80 mg/dL (ref 65–99)
Potassium: 4.4 mmol/L (ref 3.5–5.2)
Sodium: 138 mmol/L (ref 134–144)
Total Protein: 7.1 g/dL (ref 6.0–8.5)

## 2020-02-11 NOTE — Progress Notes (Signed)
All normal CMET ! Continue with Xywav.  Cc Dr Abigail Miyamoto, MD

## 2020-02-11 NOTE — Telephone Encounter (Signed)
Called the pt. There was no answer. Lvm advising of the normal findings. Advised pt to contact office if she has any concerns.

## 2020-02-11 NOTE — Telephone Encounter (Signed)
-----   Message from Melvyn Novas, MD sent at 02/11/2020  8:38 AM EDT ----- All normal CMET ! Continue with Xywav.  Cc Dr Abigail Miyamoto, MD

## 2020-03-29 ENCOUNTER — Telehealth: Payer: Self-pay | Admitting: Neurology

## 2020-03-29 ENCOUNTER — Other Ambulatory Visit: Payer: Self-pay | Admitting: Neurology

## 2020-03-29 MED ORDER — AMPHETAMINE-DEXTROAMPHETAMINE 20 MG PO TABS: ORAL_TABLET | ORAL | 0 refills | Status: DC

## 2020-03-29 NOTE — Telephone Encounter (Signed)
PT. is requesting refill for amphetamine-dextroamphetamine (ADDERALL) 20 MG tablet. She would like to be sent to pharmacy today if all possible.  Pharmacy: [facility] [phone]

## 2020-03-29 NOTE — Telephone Encounter (Signed)
I have routed this request to Dr Dohmeier for review. The pt is due for the medication and [place] registry was verified.  

## 2020-04-14 ENCOUNTER — Telehealth: Payer: Self-pay | Admitting: Neurology

## 2020-04-14 NOTE — Telephone Encounter (Signed)
PA submitted through cover my meds/ BC[employer]. This is new insurance for the patient. Will await response. [id number]

## 2020-04-20 NOTE — Telephone Encounter (Signed)
PA was denied. Denies because Sunosi was not used. Pt has been on the Xyrem medication for several years will attempt a appeal for the patient.

## 2020-04-27 DIAGNOSIS — F3342 Major depressive disorder, recurrent, in full remission: Secondary | ICD-10-CM | POA: Diagnosis not present

## 2020-04-27 MED ORDER — XYREM 500 MG/ML PO SOLN: ORAL | 5 refills | Status: DC

## 2020-04-27 NOTE — Telephone Encounter (Signed)
[facility] called, Xyrem was denied;would like to know what the next step. So we can follow up with the insurance. Contact info: [phone].

## 2020-04-27 NOTE — Addendum Note (Signed)
Addended by: Judi Cong on: 04/27/2020 03:23 PM   Modules accepted: Orders

## 2020-04-27 NOTE — Telephone Encounter (Signed)
Called the [facility] pharmacy and advised that Xyrem PA was appealed and we are still waiting on determination.

## 2020-05-03 ENCOUNTER — Telehealth: Payer: Self-pay | Admitting: Neurology

## 2020-05-03 ENCOUNTER — Other Ambulatory Visit: Payer: Self-pay | Admitting: Neurology

## 2020-05-03 MED ORDER — AMPHETAMINE-DEXTROAMPHETAMINE 20 MG PO TABS: ORAL_TABLET | ORAL | 0 refills | Status: DC

## 2020-05-03 NOTE — Telephone Encounter (Signed)
Pt request refill amphetamine-dextroamphetamine (ADDERALL) 20 MG tablet at [id number] 

## 2020-05-03 NOTE — Telephone Encounter (Signed)
I have routed this request to Dr Sethi for review. The pt is due for the medication and [place] registry was verified.    

## 2020-05-04 NOTE — Telephone Encounter (Signed)
I have routed this request to Dr Pearlean Brownie for review. The pt is due for the medication and [place] registry was verified. Confirmed he did send to the pharmacy for the patient on 05/03/2020

## 2020-05-06 ENCOUNTER — Telehealth: Payer: Self-pay | Admitting: Neurology

## 2020-05-06 ENCOUNTER — Other Ambulatory Visit: Payer: Self-pay | Admitting: Adult Health

## 2020-05-06 DIAGNOSIS — Z79899 Other long term (current) drug therapy: Secondary | ICD-10-CM

## 2020-05-06 DIAGNOSIS — G47419 Narcolepsy without cataplexy: Secondary | ICD-10-CM

## 2020-05-06 DIAGNOSIS — G47411 Narcolepsy with cataplexy: Secondary | ICD-10-CM

## 2020-05-06 MED ORDER — XYREM 500 MG/ML PO SOLN: ORAL | 5 refills | Status: DC

## 2020-05-06 NOTE — Telephone Encounter (Signed)
I was unable to change the pharmacy to certified [facility] and [facility]- CD I can also fax a paper copy if this is not going through-need the fax number -  please update me with the progress if I need to drive to the office tomorrow. TY CD

## 2020-05-06 NOTE — Progress Notes (Signed)
Patient called on call service for refill for xyrem. Called pharmacy- it has been denied- ask for them to do a bridge for two weeks. Pending.  Pharmacist states that they can only fill if prescription comes from Dr. Vickey Huger. Patient made aware.

## 2020-05-06 NOTE — Telephone Encounter (Signed)
I accepted a refill request for [facility] medication- XYREM today. CD

## 2020-05-10 ENCOUNTER — Telehealth: Payer: Self-pay | Admitting: Neurology

## 2020-05-10 NOTE — Telephone Encounter (Signed)
Refill was already sent this morning for the patient.

## 2020-05-10 NOTE — Telephone Encounter (Signed)
Pt called needing a refill on her Sodium Oxybate (XYREM) 500 MG/ML SOLN sent in to the mail order pharmacy. Pt states she is out of the medication.

## 2020-05-11 NOTE — Telephone Encounter (Signed)
Called [place] [employer] [place] [phone] provided patient's member ID for [employer] is [id number])  to check the status of appeal letter that was sent on 04/20/2020. When I called, I reached a Rep, Brandy who assisted. She states that they did in fact receive the paperwork and that it would be completed by the end of the day. She states that someone will call and provide the determination.    ** When [employer] calls back, I did advise the message could be left with the phone staff on approval or denial. If insurance denies again, Please ask that they submit the determination via fax to [phone]. I must have this information to submit to the [facility] pharmacy.

## 2020-05-11 NOTE — Telephone Encounter (Signed)
Called the patient and advised that we have been working on [employer] for Xyrem. We received the 2nd denial today. Advised that I would discuss with Dr Vickey Huger about next steps.

## 2020-05-12 ENCOUNTER — Other Ambulatory Visit: Payer: Self-pay | Admitting: Neurology

## 2020-05-12 MED ORDER — XYWAV 500 MG/ML PO SOLN: ORAL | 5 refills | Status: DC

## 2020-05-12 NOTE — Telephone Encounter (Signed)
Received a 2nd denial letter stating patient must try Sunosi. I have discussed this further with Dr Vickey Huger and the patient and Dr Dohmeier agrees the next best course of action would be to switch the patient to [facility] and treat idiopathic hypersomnolence. Will send a updated script for this change to the pharmacy.

## 2020-05-13 ENCOUNTER — Telehealth: Payer: Self-pay | Admitting: Neurology

## 2020-05-13 ENCOUNTER — Encounter: Payer: Self-pay | Admitting: Neurology

## 2020-05-13 DIAGNOSIS — G4711 Idiopathic hypersomnia with long sleep time: Secondary | ICD-10-CM | POA: Insufficient documentation

## 2020-05-13 NOTE — Telephone Encounter (Signed)
[facility] Misty Stanley) called prescription faxed for Sherry Rhodes has a future date. Can not be fill with a future date. Need prescription with a valid date. Contact info: [phone], option 3, option 4.

## 2020-05-13 NOTE — Telephone Encounter (Signed)
PA completed through cover my meds for [facility] for the patient.  [id number] Faxed sleep study and recent ov notes.

## 2020-05-13 NOTE — Telephone Encounter (Signed)
Dr Dohmeier agrees with attempting the patient on Trudee Kuster since they had discussed switching in previous visit.  The sleep study several years ago was confirmed to see idiopathic hypersomnolence. We will use this diagnosis when completing the PA.

## 2020-05-13 NOTE — Telephone Encounter (Signed)
PA approved for the patient for Xywav through the [employer]. Approved 05/13/2020-05/12/2021. Called the pt and made her aware.

## 2020-05-13 NOTE — Telephone Encounter (Signed)
I have corrected the script to reflect the correct date and will forward to [facility] pharmacy. Advised the Trudee Kuster was approved as well.

## 2020-06-14 ENCOUNTER — Other Ambulatory Visit: Payer: Self-pay | Admitting: Neurology

## 2020-06-14 ENCOUNTER — Telehealth: Payer: Self-pay | Admitting: Neurology

## 2020-06-14 MED ORDER — AMPHETAMINE-DEXTROAMPHETAMINE 20 MG PO TABS: ORAL_TABLET | ORAL | 0 refills | Status: DC

## 2020-06-14 NOTE — Telephone Encounter (Signed)
Pt request refill amphetamine-dextroamphetamine (ADDERALL) 20 MG tablet at [id number] 

## 2020-06-14 NOTE — Telephone Encounter (Signed)
I have routed this request to Dr Dohmeier for review. The pt is due for the medication and [place] registry was verified.  

## 2020-07-19 ENCOUNTER — Other Ambulatory Visit: Payer: Self-pay | Admitting: Neurology

## 2020-07-19 ENCOUNTER — Telehealth: Payer: Self-pay | Admitting: Neurology

## 2020-07-19 MED ORDER — AMPHETAMINE-DEXTROAMPHETAMINE 20 MG PO TABS: ORAL_TABLET | ORAL | 0 refills | Status: DC

## 2020-07-19 NOTE — Telephone Encounter (Signed)
I have routed this request to Dr Dohmeier for review. The pt is due for the medication and [place] registry was verified.  

## 2020-07-19 NOTE — Telephone Encounter (Signed)
Pt is requesting a refill for amphetamine-dextroamphetamine (ADDERALL) 20 MG tablet  Pharmacy: [id number]  

## 2020-08-11 ENCOUNTER — Ambulatory Visit: Payer: BC Managed Care – PPO | Admitting: Adult Health

## 2020-08-11 ENCOUNTER — Other Ambulatory Visit: Payer: Self-pay

## 2020-08-11 ENCOUNTER — Encounter: Payer: Self-pay | Admitting: Adult Health

## 2020-08-11 VITALS — BP 120/73 | HR 109 | Ht 63.0 in | Wt 140.0 lb

## 2020-08-11 DIAGNOSIS — G47411 Narcolepsy with cataplexy: Secondary | ICD-10-CM

## 2020-08-11 NOTE — Progress Notes (Signed)
PATIENT: Sherry Rhodes DOB: 1981-04-06  REASON FOR VISIT: follow up HISTORY FROM: patient  HISTORY OF PRESENT ILLNESS: Today 08/11/20:  Sherry Rhodes is a 40 year old female with a history of narcolepsy without cataplexy.  She returns today for follow-up.  She remains on Xyrem wave and Adderall.  Reports that this continues to work well for her.  Denies any trouble staying awake while at work and driving.  Reports that she does plan to travel to [place] next month.  Her flight is overnight.  She returns today for an evaluation.  HISTORY 07/10/19:  Sherry Rhodes is a 40 year old female with a history of narcolepsy.  She returns today for follow-up.  She remains on Xyrem and Adderall.  Reports that these are working well for her.  Denies any significant change in her depression.  Denies suicidal thoughts.  Denies falling asleep at work or while driving.   REVIEW OF SYSTEMS: Out of a complete 14 system review of symptoms, the patient complains only of the following symptoms, and all other reviewed systems are negative.  Epworth sleepiness score 4  ALLERGIES: Allergies  Allergen Reactions  . Bee Venom     HOME MEDICATIONS: Outpatient Medications Prior to Visit  Medication Sig Dispense Refill  . amphetamine-dextroamphetamine (ADDERALL) 20 MG tablet Take one tablet in AM, one tablet at 12 noon, and one half tablet at 4 pm. 75 tablet 0  . buPROPion (WELLBUTRIN XL) 150 MG 24 hr tablet 450 mg.   5  . Ca, Mg, K, and Na Oxybates (XYWAV) 500 MG/ML SOLN Take 4.5 G twice nightly 540 mL 5  . lamoTRIgine (LAMICTAL) 100 MG tablet TAKE 1 TABLET(100 MG) BY MOUTH DAILY (Patient taking differently: Take 200 mg by mouth 2 (two) times daily. 100mg  BID) 30 tablet 0  . levonorgestrel (MIRENA) 20 MCG/24HR IUD 1 each by Intrauterine route once.    . loratadine (CLARITIN) 10 MG tablet Take 10 mg by mouth daily.    . Vilazodone HCl (VIIBRYD) 40 MG TABS Take 40 mg by mouth daily.     No  facility-administered medications prior to visit.    PAST MEDICAL HISTORY: Past Medical History:  Diagnosis Date  . Controlled narcolepsy 05/27/2013  . Depression   . EDS (Ehlers-Danlos syndrome)    persistent excessive daytime sleepiness  . Hypersomnia, persistent    MSLT no SREM but lass than 8 minutes SL.   05/29/2013 PTSD (post-traumatic stress disorder)   . Recovering alcoholic (HCC)     PAST SURGICAL HISTORY: Past Surgical History:  Procedure Laterality Date  . LASIK Right 6/12   good success- vision correction    FAMILY HISTORY: Family History  Problem Relation Age of Onset  . Parkinson's disease Maternal Grandmother   . Drug abuse Maternal Grandfather   . Cancer Paternal Grandmother   . Cancer Paternal Grandfather   . Drug abuse Paternal Grandfather     SOCIAL HISTORY: Social History   Socioeconomic History  . Marital status: Single    Spouse name: Not on file  . Number of children: 0  . Years of education: Bachelors  . Highest education level: Not on file  Occupational History    Employer: [employer]  Tobacco Use  . Smoking status: Former 8/12  . Smokeless tobacco: Never Used  . Tobacco comment: quit smoking 3/10  Substance and Sexual Activity  . Alcohol use: No    Comment: quit alcohol 02/2003-recovering alcohol abuser  . Drug use: No  . Sexual activity: Not on  file  Other Topics Concern  . Not on file  Social History Narrative       Patient is single, recovering ETOH abuser. Patient has her bachelors degree  Patient has her masters from [employer].. 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    Patient is right-handed.   Patient does not drink any caffeine.            Social Determinants of Health   Financial Resource Strain: Not on file  Food Insecurity: Not on file  Transportation Needs: Not on file  Physical Activity: Not on file  Stress: Not on file  Social Connections: Not on file  Intimate Partner Violence: Not on file       PHYSICAL EXAM  Vitals:   08/11/20 0833  BP: 120/73  Pulse: (!) 109  Weight: 140 lb (63.5 kg)  Height: 5\' 3"  (1.6 m)   Body mass index is 24.8 kg/m.  Generalized: Well developed, in no acute distress   Neurological examination  Mentation: Alert oriented to time, place, history taking. Follows all commands speech and language fluent Cranial nerve II-XII: Pupils were equal round reactive to light. Extraocular movements were full, visual field were full on confrontational test. Head turning and shoulder shrug  were normal and symmetric. Motor: The motor testing reveals 5 over 5 strength of all 4 extremities. Good symmetric motor tone is noted throughout.  Sensory: Sensory testing is intact to soft touch on all 4 extremities. No evidence of extinction is noted.  Coordination: Cerebellar testing reveals good finger-nose-finger and heel-to-shin bilaterally.  Gait and station: Gait is normal.  Reflexes: Deep tendon reflexes are symmetric and normal bilaterally.   DIAGNOSTIC DATA (LABS, IMAGING, TESTING) - I reviewed patient records, labs, notes, testing and imaging myself where available.  Lab Results  Component Value Date   WBC 5.7 01/09/2019   HGB 12.9 01/09/2019   HCT 38.6 01/09/2019   MCV 95 01/09/2019   PLT 310 01/09/2019      Component Value Date/Time   NA 138 02/10/2020 0912   K 4.4 02/10/2020 0912   CL 101 02/10/2020 0912   CO2 26 02/10/2020 0912   GLUCOSE 80 02/10/2020 0912   BUN 16 02/10/2020 0912   CREATININE 0.99 02/10/2020 0912   CALCIUM 9.9 02/10/2020 0912   PROT 7.1 02/10/2020 0912   ALBUMIN 4.5 02/10/2020 0912   AST 13 02/10/2020 0912   ALT 18 02/10/2020 0912   ALKPHOS 52 02/10/2020 0912   BILITOT 0.2 02/10/2020 0912   GFRNONAA 72 02/10/2020 0912   GFRAA 83 02/10/2020 0912      ASSESSMENT AND PLAN 40 y.o. year old female  has a past medical history of Controlled narcolepsy (05/27/2013), Depression, EDS (Ehlers-Danlos syndrome), Hypersomnia,  persistent, PTSD (post-traumatic stress disorder), and Recovering alcoholic (HCC). here with:  1.  Narcolepsy  --Continue Xywav --Continue Adderall 20 mg in the morning and at noon and a half a tablet at 4 PM --Advised that she should hold Xywav for her overnight flight. Resume the following night once she arrives in [place].  I spent 25 minutes of face-to-face and non-face-to-face time with patient.  This included previsit chart review, lab review, study review, order entry, electronic health record documentation, patient education.  05/29/2013, MSN, NP-C 08/11/2020, 8:49 AM [facility] [address] [place], [place] [phone]

## 2020-08-11 NOTE — Patient Instructions (Signed)
Your Plan:  Continue xywav Continue Adderall If your symptoms worsen or you develop new symptoms please let us know.   Thank you for coming to see [place] at [facility]. I hope we have been able to provide you high quality care today.  You may receive a patient satisfaction survey over the next few weeks. We would appreciate your feedback and comments so that we may continue to improve ourselves and the health of our patients.

## 2020-08-12 LAB — COMPREHENSIVE METABOLIC PANEL
ALT: 15 IU/L (ref 0–32)
AST: 13 IU/L (ref 0–40)
Albumin/Globulin Ratio: 1.6 (ref 1.2–2.2)
Albumin: 4.4 g/dL (ref 3.8–4.8)
Alkaline Phosphatase: 62 IU/L (ref 44–121)
BUN/Creatinine Ratio: 13 (ref 9–23)
BUN: 14 mg/dL (ref 6–24)
Bilirubin Total: 0.3 mg/dL (ref 0.0–1.2)
CO2: 24 mmol/L (ref 20–29)
Calcium: 9.7 mg/dL (ref 8.7–10.2)
Chloride: 100 mmol/L (ref 96–106)
Creatinine, Ser: 1.05 mg/dL — ABNORMAL HIGH (ref 0.57–1.00)
Globulin, Total: 2.7 g/dL (ref 1.5–4.5)
Glucose: 103 mg/dL — ABNORMAL HIGH (ref 65–99)
Potassium: 4.4 mmol/L (ref 3.5–5.2)
Sodium: 139 mmol/L (ref 134–144)
Total Protein: 7.1 g/dL (ref 6.0–8.5)
eGFR: 69 mL/min/{1.73_m2} (ref >59)

## 2020-08-24 ENCOUNTER — Other Ambulatory Visit: Payer: Self-pay | Admitting: Adult Health

## 2020-08-24 NOTE — Telephone Encounter (Signed)
Pt is requesting a refill for amphetamine-dextroamphetamine (ADDERALL) 20 MG tablet  Pharmacy: [id number]  

## 2020-08-24 NOTE — Addendum Note (Signed)
Addended by: Arther Abbott on: 08/24/2020 05:20 PM   Modules accepted: Orders

## 2020-08-24 NOTE — Telephone Encounter (Signed)
Last seen 08/11/20 and next follow up 02/15/21. Checked drug registry and she last refilled 07/19/20  #75.

## 2020-08-27 ENCOUNTER — Telehealth: Payer: Self-pay | Admitting: Adult Health

## 2020-08-27 NOTE — Telephone Encounter (Signed)
Pt called, has called in a refill request for my  amphetamine-dextroamphetamine (ADDERALL) 20 MG tablet. Checking on why refill has not been sent to the pharmacy. Would like a call from the nurse.

## 2020-08-30 MED ORDER — AMPHETAMINE-DEXTROAMPHETAMINE 20 MG PO TABS: ORAL_TABLET | ORAL | 0 refills | Status: DC

## 2020-09-30 ENCOUNTER — Other Ambulatory Visit: Payer: Self-pay | Admitting: Neurology

## 2020-09-30 MED ORDER — XYWAV 500 MG/ML PO SOLN: ORAL | 5 refills | Status: DC

## 2020-10-05 ENCOUNTER — Other Ambulatory Visit: Payer: Self-pay | Admitting: Adult Health

## 2020-10-05 MED ORDER — AMPHETAMINE-DEXTROAMPHETAMINE 20 MG PO TABS: ORAL_TABLET | ORAL | 0 refills | Status: DC

## 2020-10-05 NOTE — Telephone Encounter (Signed)
Pt request refill amphetamine-dextroamphetamine (ADDERALL) 20 MG tablet at [id number] 

## 2020-11-05 ENCOUNTER — Other Ambulatory Visit: Payer: Self-pay | Admitting: Adult Health

## 2020-11-05 DIAGNOSIS — Z91030 Bee allergy status: Secondary | ICD-10-CM | POA: Diagnosis not present

## 2020-11-05 DIAGNOSIS — F3342 Major depressive disorder, recurrent, in full remission: Secondary | ICD-10-CM | POA: Diagnosis not present

## 2020-11-05 NOTE — Telephone Encounter (Signed)
Pt requesting refill for amphetamine-dextroamphetamine (ADDERALL) 20 MG tablet. Pharmacy [facility] [phone].

## 2020-11-09 MED ORDER — AMPHETAMINE-DEXTROAMPHETAMINE 20 MG PO TABS: ORAL_TABLET | ORAL | 0 refills | Status: DC

## 2020-11-09 NOTE — Addendum Note (Signed)
Addended by: Guy Begin on: 11/09/2020 08:05 AM   Modules accepted: Orders

## 2020-11-15 ENCOUNTER — Telehealth: Payer: Self-pay | Admitting: Adult Health

## 2020-11-15 MED ORDER — AMPHETAMINE-DEXTROAMPHETAMINE 20 MG PO TABS: ORAL_TABLET | ORAL | 0 refills | Status: DC

## 2020-11-15 NOTE — Telephone Encounter (Signed)
Pt requesting refill for amphetamine-dextroamphetamine (ADDERALL) 20 MG tablet. Pharmacy [facility] #[id number].  

## 2020-11-15 NOTE — Telephone Encounter (Signed)
Will be addressed in the other phone call.

## 2020-11-15 NOTE — Telephone Encounter (Signed)
Spoke with patient and advised adderall rx went to [facility] on [address]. She verbalized appreciation.

## 2020-11-15 NOTE — Telephone Encounter (Addendum)
Spoke with [place] at [facility] on [address]. She canceled adderall 20 mg rx so pt will only have one rx pending when we send to [facility] on [address]. They cannot transfer the refill.   Checked registry again.  Patient filled 30 day supply last on 10/05/2020 Dextroamp-Amphetamin 20 Mg Tab  #75/30. Rx sent to MM NP to e-scribe.

## 2020-11-15 NOTE — Telephone Encounter (Signed)
Pt called, amphetamine-dextroamphetamine (ADDERALL) 20 MG tablet is out if stock at [facility] on [address]. Need to call in at [facility] [place], [place]. Would like a call from the nurse when refill has been sent.

## 2020-12-21 ENCOUNTER — Other Ambulatory Visit: Payer: Self-pay | Admitting: Adult Health

## 2020-12-21 NOTE — Addendum Note (Signed)
Addended by: Bertram Savin on: 12/21/2020 12:32 PM   Modules accepted: Orders

## 2020-12-21 NOTE — Telephone Encounter (Signed)
Per [place] registry, last filled on 11/15/2020 Dextroamp-Amphetamin 20 Mg Tab #75/30 prescribed by MM NP.  Patient has a pending appointment on 02/15/2021.  Refill request sent to [facility].

## 2020-12-21 NOTE — Telephone Encounter (Signed)
Pt called requesting refill for amphetamine-dextroamphetamine (ADDERALL) 20 MG tablet. [facility] [phone].

## 2020-12-22 MED ORDER — AMPHETAMINE-DEXTROAMPHETAMINE 20 MG PO TABS: ORAL_TABLET | ORAL | 0 refills | Status: DC

## 2020-12-23 NOTE — Telephone Encounter (Signed)
Pt called stating her  amphetamine-dextroamphetamine (ADDERALL) 20 MG tablet was on back order and she was only able to get 35/75. She states more than likely she will be calling back a lot sooner for another refill.

## 2020-12-23 NOTE — Telephone Encounter (Signed)
[facility] should be able to provide her with the remainder when it arrives.

## 2021-01-05 ENCOUNTER — Telehealth: Payer: Self-pay | Admitting: Adult Health

## 2021-01-05 NOTE — Telephone Encounter (Addendum)
75 tablets were prescribed on 12/22/2020.  Patient last filled 35 tablets (due to pharmacy stock) on 12/23/20.  I called [facility].  I was told they could fill the remainder of the patient's prescription as the Adderall is now in stock.  They can only fill this within 30 days of the original prescription.  Patient will need to call them.  I spoke with the patient and let her know.  She will reach out to them.  She stated when she filled the 35 tablets that she was told the rest of the prescription would be inactivated.  She will call us back if needed.

## 2021-01-05 NOTE — Telephone Encounter (Signed)
Pt is needing a refill request for her amphetamine-dextroamphetamine (ADDERALL) 20 MG tablet sent in to the [facility] on [address]

## 2021-01-27 ENCOUNTER — Other Ambulatory Visit: Payer: Self-pay | Admitting: Adult Health

## 2021-01-27 MED ORDER — AMPHETAMINE-DEXTROAMPHETAMINE 20 MG PO TABS: ORAL_TABLET | ORAL | 0 refills | Status: DC

## 2021-01-27 NOTE — Telephone Encounter (Signed)
Per [place] registry, last filled on 01/05/2021 Dextroamp-Amphetamin 20 Mg Tab #40/16.  Appt pending 02/15/21. Rx refill sent to MM NP.

## 2021-01-27 NOTE — Telephone Encounter (Signed)
Pt has called to report that [facility] #[id number]  does not have her  amphetamine-dextroamphetamine (ADDERALL) 20 MG tablet  in stock.  Pt is asking if it can be called into [facility] on [address], please call.

## 2021-01-27 NOTE — Telephone Encounter (Signed)
Pt request refill amphetamine-dextroamphetamine (ADDERALL) 20 MG tablet at [id number] 

## 2021-01-31 NOTE — Telephone Encounter (Signed)
Fax confirmation received Thursday 01-27-21 1700.  [phone].

## 2021-02-15 ENCOUNTER — Ambulatory Visit: Payer: BC Managed Care – PPO | Admitting: Adult Health

## 2021-02-15 ENCOUNTER — Other Ambulatory Visit: Payer: Self-pay

## 2021-02-15 ENCOUNTER — Encounter: Payer: Self-pay | Admitting: Adult Health

## 2021-02-15 VITALS — BP 112/76 | HR 96 | Ht 63.0 in | Wt 142.4 lb

## 2021-02-15 DIAGNOSIS — G47419 Narcolepsy without cataplexy: Secondary | ICD-10-CM | POA: Diagnosis not present

## 2021-02-15 DIAGNOSIS — G47411 Narcolepsy with cataplexy: Secondary | ICD-10-CM

## 2021-02-15 NOTE — Progress Notes (Signed)
PATIENT: Sherry Rhodes DOB: 1980/06/17  REASON FOR VISIT: follow up HISTORY FROM: patient   HISTORY OF PRESENT ILLNESS: Today 02/15/21 Sherry Rhodes is a 40 year old female with a history of narcolepsy.  She returns today for follow-up.  She continues on Xyrem and Adderall.  She reports that this medication continues to work well for her. Patient endorses that the medication continues to work well for her. She denies any excess sleepiness, jitters, decreased appetite, or trouble sleeping. 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 She denies worsening depression or thoughts of hurting herself or others at this time.     HISTORY   08/11/20: Sherry Rhodes is a 40 year old female with a history of narcolepsy.  She returns today for follow-up.  She continues on Xyrem and Adderall.  She reports that this medication continues to work well for her.  She has a full-time job with no difficulty.  She drives without difficulty.  Denies any worsening of depression.  Denies suicidal thoughts.  She returns today for evaluation.  07/10/19: Sherry Rhodes is a 40 year old female with a history of narcolepsy.  She returns today for follow-up.  She remains on Xyrem and Adderall.  Reports that these are working well for her.  Denies any significant change in her depression.  Denies suicidal thoughts.  Denies falling asleep at work or while driving.    REVIEW OF SYSTEMS: Out of a complete 14 system review of symptoms, the patient complains only of the following symptoms, and all other reviewed systems are negative.  ALLERGIES: Allergies  Allergen Reactions   Bee Venom     HOME MEDICATIONS: Outpatient Medications Prior to Visit  Medication Sig Dispense Refill   amphetamine-dextroamphetamine (ADDERALL) 20 MG tablet Take one tablet in AM, one tablet at 12 noon, and one half tablet at 4 pm. 75 tablet 0   buPROPion (WELLBUTRIN XL) 150 MG 24 hr tablet 450 mg.   5   Ca, Mg, K, and Na  Oxybates (XYWAV) 500 MG/ML SOLN Take 4.5 G twice nightly 540 mL 5   lamoTRIgine (LAMICTAL) 100 MG tablet TAKE 1 TABLET(100 MG) BY MOUTH DAILY (Patient taking differently: Take 200 mg by mouth 2 (two) times daily. 100mg  BID) 30 tablet 0   levonorgestrel (MIRENA) 20 MCG/24HR IUD 1 each by Intrauterine route once.     loratadine (CLARITIN) 10 MG tablet Take 10 mg by mouth daily.     Vilazodone HCl (VIIBRYD) 40 MG TABS Take 40 mg by mouth daily.     No facility-administered medications prior to visit.    PAST MEDICAL HISTORY: Past Medical History:  Diagnosis Date   Controlled narcolepsy 05/27/2013   Depression    EDS (Ehlers-Danlos syndrome)    persistent excessive daytime sleepiness   Hypersomnia, persistent    MSLT no SREM but lass than 8 minutes SL.    PTSD (post-traumatic stress disorder)    Recovering alcoholic (HCC)     PAST SURGICAL HISTORY: Past Surgical History:  Procedure Laterality Date   LASIK Right 6/12   good success- vision correction    FAMILY HISTORY: Family History  Problem Relation Age of Onset   Parkinson's disease Maternal Grandmother    Drug abuse Maternal Grandfather    Cancer Paternal Grandmother    Cancer Paternal Grandfather    Drug abuse Paternal Grandfather     SOCIAL HISTORY: Social History   Socioeconomic History   Marital status: Single    Spouse name: Not on file  Number of children: 0   Years of education: Bachelors   Highest education level: Not on file  Occupational History    Employer: [employer]  Tobacco Use   Smoking status: Former   Smokeless tobacco: Never   Tobacco comments:    quit smoking 3/10  Substance and Sexual Activity   Alcohol use: No    Comment: quit alcohol 02/2003-recovering alcohol abuser   Drug use: No   Sexual activity: Not on file  Other Topics Concern   Not on file  Social History Narrative       Patient is single, recovering ETOH abuser. Patient has her bachelors degree  Patient has her masters  from [employer].. 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    Patient is right-handed.   Patient does not drink any caffeine.            Social Determinants of Health   Financial Resource Strain: Not on file  Food Insecurity: Not on file  Transportation Needs: Not on file  Physical Activity: Not on file  Stress: Not on file  Social Connections: Not on file  Intimate Partner Violence: Not on file      PHYSICAL EXAM  There were no vitals filed for this visit. There is no height or weight on file to calculate BMI.  Generalized: Well developed, in no acute distress   Neurological examination  Mentation: Alert oriented to time, place, history taking. Follows all commands speech and language fluent Cranial nerve II-XII: Pupils were equal round reactive to light. Extraocular movements were full, visual field were full on confrontational test. Facial sensation and strength were normal. Uvula tongue midline. Head turning and shoulder shrug  were normal and symmetric. Motor: The motor testing reveals 5 over 5 strength of all 4 extremities. Good symmetric motor tone is noted throughout.  Sensory: Sensory testing is intact to soft touch on all 4 extremities. No evidence of extinction is noted.  Coordination: Cerebellar testing reveals good finger-nose-finger and heel-to-shin bilaterally.  Gait and station: Gait is normal. Tandem gait is normal. Romberg is negative. No drift is seen.  Reflexes: Deep tendon reflexes are symmetric and normal bilaterally.   DIAGNOSTIC DATA (LABS, IMAGING, TESTING) - I reviewed patient records, labs, notes, testing and imaging myself where available.  Lab Results  Component Value Date   WBC 5.7 01/09/2019   HGB 12.9 01/09/2019   HCT 38.6 01/09/2019   MCV 95 01/09/2019   PLT 310 01/09/2019      Component Value Date/Time   NA 139 08/11/2020 0907   K 4.4 08/11/2020 0907   CL 100 08/11/2020 0907   CO2 24 08/11/2020 0907   GLUCOSE 103 (H) 08/11/2020 0907    BUN 14 08/11/2020 0907   CREATININE 1.05 (H) 08/11/2020 0907   CALCIUM 9.7 08/11/2020 0907   PROT 7.1 08/11/2020 0907   ALBUMIN 4.4 08/11/2020 0907   AST 13 08/11/2020 0907   ALT 15 08/11/2020 0907   ALKPHOS 62 08/11/2020 0907   BILITOT 0.3 08/11/2020 0907   GFRNONAA 72 02/10/2020 0912   GFRAA 83 02/10/2020 0912   No results found for: CHOL, HDL, LDLCALC, LDLDIRECT, TRIG, CHOLHDL No results found for: KKXF8H No results found for: VITAMINB12 No results found for: TSH    ASSESSMENT AND PLAN 40 y.o. year old female  has a past medical history of Controlled narcolepsy (05/27/2013), Depression, EDS (Ehlers-Danlos syndrome), Hypersomnia, persistent, PTSD (post-traumatic stress disorder), and Recovering alcoholic (HCC). here with   Narcolepsy --Continue Xywav --Continue  Adderall 20 mg in the morning and at noon and a half a tablet at 4 PM -- Blood work being completed by her PCP.  -- Follow up in 6 months or sooner if needed.    Butch Penny, MSN, NP-C 02/15/2021, 9:20 AM [facility] [address] [place], [place] [address] [phone]

## 2021-03-04 ENCOUNTER — Other Ambulatory Visit: Payer: Self-pay | Admitting: Adult Health

## 2021-03-04 NOTE — Telephone Encounter (Signed)
Pt has called for a refill on her amphetamine-dextroamphetamine (ADDERALL) 20 MG tablet to  [facility] [phone]

## 2021-03-07 ENCOUNTER — Other Ambulatory Visit: Payer: Self-pay | Admitting: *Deleted

## 2021-03-07 MED ORDER — AMPHETAMINE-DEXTROAMPHETAMINE 20 MG PO TABS: ORAL_TABLET | ORAL | 0 refills | Status: DC

## 2021-03-07 NOTE — Telephone Encounter (Signed)
Received a call from the pharmacist at [facility] prescription for Adderall needs to be E scribed for the patient.  I placed an E prescription with normal versus print.

## 2021-03-07 NOTE — Telephone Encounter (Signed)
Pt up to date on appts. Per [place] registry, last filled on 02/06/2021 Dextroamp-Amphetamin 20 Mg Tab #055/22 and prior to that #20/8 on 01/29/21. Rx refill sent to MM NP to e-scribe.

## 2021-03-08 ENCOUNTER — Other Ambulatory Visit: Payer: Self-pay | Admitting: Neurology

## 2021-03-08 MED ORDER — XYWAV 500 MG/ML PO SOLN: ORAL | 5 refills | Status: DC

## 2021-04-11 ENCOUNTER — Telehealth: Payer: Self-pay | Admitting: Adult Health

## 2021-04-11 ENCOUNTER — Other Ambulatory Visit: Payer: Self-pay | Admitting: *Deleted

## 2021-04-11 MED ORDER — AMPHETAMINE-DEXTROAMPHETAMINE 20 MG PO TABS: ORAL_TABLET | ORAL | 0 refills | Status: DC

## 2021-04-11 NOTE — Telephone Encounter (Signed)
Pt is requesting a refill for amphetamine-dextroamphetamine (ADDERALL) 20 MG tablet.  Pharmacy: [facility] [phone]

## 2021-04-11 NOTE — Telephone Encounter (Signed)
Per [place] registry, last filled on 03/07/2021 Dextroamp-Amphetamin 20 Mg Tab #75. Megan NP out of office today. Rx request sent to Dr Vickey Huger.

## 2021-04-12 ENCOUNTER — Telehealth: Payer: Self-pay | Admitting: Neurology

## 2021-04-12 NOTE — Telephone Encounter (Signed)
PA completed on CMM/[employer]BS for idiopathic hypersomnolence diagnosis Pt has tried and failed concerta, ritalin, modafinil, and adderall She was started on Xyrem in 2014 and then switched to [facility] once that was made available because of the lower sodium.  She has tolerated Xywav   [id number] Will await response

## 2021-04-21 NOTE — Telephone Encounter (Signed)
PA approved for the pt 04/18/2021-04/17/2022 through [facility]- REF # [facility]

## 2021-05-16 ENCOUNTER — Telehealth: Payer: Self-pay | Admitting: Adult Health

## 2021-05-16 MED ORDER — AMPHETAMINE-DEXTROAMPHETAMINE 20 MG PO TABS: ORAL_TABLET | ORAL | 0 refills | Status: DC

## 2021-05-16 NOTE — Telephone Encounter (Signed)
Pt is up-to-date on appts. Pending appt for 08/23/21. Rx last filled on 04/12/2021 #75/30 per [place] registry. Refill sent to MM NP.

## 2021-05-16 NOTE — Addendum Note (Signed)
Addended by: Bertram Savin on: 05/16/2021 04:03 PM   Modules accepted: Orders

## 2021-05-16 NOTE — Telephone Encounter (Signed)
Pt has called for a refill on her amphetamine-dextroamphetamine (ADDERALL) 20 MG tablet to  [facility] #[id number] .  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

## 2021-05-19 ENCOUNTER — Other Ambulatory Visit: Payer: Self-pay | Admitting: *Deleted

## 2021-05-19 ENCOUNTER — Encounter: Payer: Self-pay | Admitting: Adult Health

## 2021-05-19 MED ORDER — AMPHETAMINE-DEXTROAMPHETAMINE 20 MG PO TABS: ORAL_TABLET | ORAL | 0 refills | Status: DC

## 2021-05-19 NOTE — Telephone Encounter (Signed)
Pt called and [facility] does not have any in stock or any other [facility] that she [facility].  Her last fill 04-12-2022 #75. She would like to [facility]et at [facility] [address].  (She did not pick up or get the one at [facility]05-16-2021.  She is headed out of town.

## 2021-05-19 NOTE — Telephone Encounter (Signed)
This message was sent in other refill (med).  To be sent to [facility] [address].

## 2021-05-19 NOTE — Telephone Encounter (Signed)
I called [name] for pt that did escribe to [facility] as her request.  This was done 1647 05-16-2021 (confirmed received).  Pt ot Sherry Rhodes.  Not sure if needing something else? PA??  Pt to return call if needed.

## 2021-05-19 NOTE — Telephone Encounter (Signed)
Pt has called to report she is still waiting to be able to pick up her amphetamine-dextroamphetamine (ADDERALL) 20 MG tablet  from [place] [id number] .   Pt is going out of town tomorrow morning and needs this today.

## 2021-05-25 DIAGNOSIS — Z13220 Encounter for screening for lipoid disorders: Secondary | ICD-10-CM | POA: Diagnosis not present

## 2021-05-25 DIAGNOSIS — Z23 Encounter for immunization: Secondary | ICD-10-CM | POA: Diagnosis not present

## 2021-05-25 DIAGNOSIS — Z0000 Encounter for general adult medical examination without abnormal findings: Secondary | ICD-10-CM | POA: Diagnosis not present

## 2021-05-25 DIAGNOSIS — F3342 Major depressive disorder, recurrent, in full remission: Secondary | ICD-10-CM | POA: Diagnosis not present

## 2021-06-20 ENCOUNTER — Other Ambulatory Visit: Payer: Self-pay | Admitting: Adult Health

## 2021-06-20 MED ORDER — AMPHETAMINE-DEXTROAMPHETAMINE 20 MG PO TABS: ORAL_TABLET | ORAL | 0 refills | Status: DC

## 2021-06-20 NOTE — Telephone Encounter (Signed)
Pt is requesting a refill for amphetamine-dextroamphetamine (ADDERALL) 20 MG tablet.  [id number]  

## 2021-06-20 NOTE — Telephone Encounter (Signed)
Patient is up to date on her appointments and is due for a refill on her adderall.  Controlled Substance Registry checked and is appropriate.

## 2021-07-06 NOTE — Telephone Encounter (Signed)
error 

## 2021-07-28 ENCOUNTER — Other Ambulatory Visit: Payer: Self-pay | Admitting: Adult Health

## 2021-07-28 NOTE — Telephone Encounter (Signed)
Pt request refill for amphetamine-dextroamphetamine (ADDERALL) [facility] [phone] ?

## 2021-08-01 DIAGNOSIS — E7800 Pure hypercholesterolemia, unspecified: Secondary | ICD-10-CM | POA: Diagnosis not present

## 2021-08-01 MED ORDER — AMPHETAMINE-DEXTROAMPHETAMINE 20 MG PO TABS: ORAL_TABLET | ORAL | 0 refills | Status: DC

## 2021-08-09 ENCOUNTER — Other Ambulatory Visit: Payer: Self-pay | Admitting: Neurology

## 2021-08-09 MED ORDER — XYWAV 500 MG/ML PO SOLN: ORAL | 0 refills | Status: DC

## 2021-08-11 DIAGNOSIS — Z113 Encounter for screening for infections with a predominantly sexual mode of transmission: Secondary | ICD-10-CM | POA: Diagnosis not present

## 2021-08-11 DIAGNOSIS — Z6825 Body mass index (BMI) 25.0-25.9, adult: Secondary | ICD-10-CM | POA: Diagnosis not present

## 2021-08-11 DIAGNOSIS — Z124 Encounter for screening for malignant neoplasm of cervix: Secondary | ICD-10-CM | POA: Diagnosis not present

## 2021-08-11 DIAGNOSIS — Z01419 Encounter for gynecological examination (general) (routine) without abnormal findings: Secondary | ICD-10-CM | POA: Diagnosis not present

## 2021-08-11 DIAGNOSIS — Z1231 Encounter for screening mammogram for malignant neoplasm of breast: Secondary | ICD-10-CM | POA: Diagnosis not present

## 2021-08-19 DIAGNOSIS — Z30431 Encounter for routine checking of intrauterine contraceptive device: Secondary | ICD-10-CM | POA: Diagnosis not present

## 2021-08-19 DIAGNOSIS — T8389XA Other specified complication of genitourinary prosthetic devices, implants and grafts, initial encounter: Secondary | ICD-10-CM | POA: Diagnosis not present

## 2021-08-23 ENCOUNTER — Encounter: Payer: Self-pay | Admitting: Adult Health

## 2021-08-23 ENCOUNTER — Ambulatory Visit: Payer: BC Managed Care – PPO | Admitting: Adult Health

## 2021-08-23 VITALS — BP 112/74 | HR 97 | Ht 63.0 in | Wt 143.4 lb

## 2021-08-23 DIAGNOSIS — G47419 Narcolepsy without cataplexy: Secondary | ICD-10-CM | POA: Diagnosis not present

## 2021-08-23 NOTE — Progress Notes (Signed)
? ? ?PATIENT: Sherry Rhodes ?DOB: 07/21/1980 ? ?REASON FOR VISIT: follow up ?HISTORY FROM: patient ? ? ?HISTORY OF PRESENT ILLNESS: ?Today 08/23/21 ? ?Sherry Rhodes is a 41 year old female with a history of narcolepsy.  She returns today for follow-up.  She is currently on Xywav and Adderall.  She reports that the combination continues to work well for her.  Denies any trouble staying awake while at work or driving.  Denies worsening depression.  Returns today for an evaluation. ? ? ?02/15/21: Sherry Rhodes is a 41 year old female with a history of narcolepsy.  She returns today for follow-up.  She continues on Xyrem and Adderall.  She reports that this medication continues to work well for her. Patient endorses that the medication continues to work well for her. She denies any excess sleepiness, jitters, decreased appetite, or trouble sleeping. 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 She denies worsening depression or thoughts of hurting herself or others at this time.   ? ? ?HISTORY  ? ?08/11/20: ?Sherry Rhodes is a 41 year old female with a history of narcolepsy.  She returns today for follow-up.  She continues on Xyrem and Adderall.  She reports that this medication continues to work well for her.  She has a full-time job with no difficulty.  She drives without difficulty.  Denies any worsening of depression.  Denies suicidal thoughts.  She returns today for evaluation. ? ?07/10/19: ?Sherry Rhodes is a 41 year old female with a history of narcolepsy.  She returns today for follow-up.  She remains on Xyrem and Adderall.  Reports that these are working well for her.  Denies any significant change in her depression.  Denies suicidal thoughts.  Denies falling asleep at work or while driving. ?  ? ?REVIEW OF SYSTEMS: Out of a complete 14 system review of symptoms, the patient complains only of the following symptoms, and all other reviewed systems are negative. ? ?ALLERGIES: ?Allergies  ?Allergen  Reactions  ? Bee Venom   ? ? ?HOME MEDICATIONS: ?Outpatient Medications Prior to Visit  ?Medication Sig Dispense Refill  ? amphetamine-dextroamphetamine (ADDERALL) 20 MG tablet Take one tablet in AM, one tablet at 12 noon, and one half tablet at 4 pm. 75 tablet 0  ? buPROPion (WELLBUTRIN XL) 150 MG 24 hr tablet 450 mg.   5  ? Ca, Mg, K, and Na Oxybates (XYWAV) 500 MG/ML SOLN Take 4.5 G twice nightly 540 mL 0  ? lamoTRIgine (LAMICTAL) 100 MG tablet TAKE 1 TABLET(100 MG) BY MOUTH DAILY (Patient taking differently: Take 200 mg by mouth 2 (two) times daily. 100mg  BID) 30 tablet 0  ? levonorgestrel (MIRENA) 20 MCG/24HR IUD 1 each by Intrauterine route once.    ? loratadine (CLARITIN) 10 MG tablet Take 10 mg by mouth daily.    ? Vilazodone HCl (VIIBRYD) 40 MG TABS Take 40 mg by mouth daily.    ? ?No facility-administered medications prior to visit.  ? ? ?PAST MEDICAL HISTORY: ?Past Medical History:  ?Diagnosis Date  ? Controlled narcolepsy 05/27/2013  ? Depression   ? EDS (Ehlers-Danlos syndrome)   ? persistent excessive daytime sleepiness  ? Hypersomnia, persistent   ? MSLT no SREM but lass than 8 minutes SL.   ? PTSD (post-traumatic stress disorder)   ? Recovering alcoholic (HCC)   ? ? ?PAST SURGICAL HISTORY: ?Past Surgical History:  ?Procedure Laterality Date  ? LASIK Right 6/12  ? good success- vision correction  ? ? ?FAMILY HISTORY: ?Family History  ?  Problem Relation Age of Onset  ? Parkinson's disease Maternal Grandmother   ? Drug abuse Maternal Grandfather   ? Cancer Paternal Grandmother   ? Cancer Paternal Grandfather   ? Drug abuse Paternal Grandfather   ? Narcolepsy Neg Hx   ? ? ?SOCIAL HISTORY: ?Social History  ? ?Socioeconomic History  ? Marital status: Single  ?  Spouse name: Not on file  ? Number of children: 0  ? Years of education: Bachelors  ? Highest education level: Not on file  ?Occupational History  ?  Employer: [employer]  ?Tobacco Use  ? Smoking status: Former  ? Smokeless tobacco: Never  ?  Tobacco comments:  ?  quit smoking 3/10  ?Vaping Use  ? Vaping Use: Never used  ?Substance and Sexual Activity  ? Alcohol use: No  ?  Comment: quit alcohol 02/2003-recovering alcohol abuser  ? Drug use: No  ? Sexual activity: Not on file  ?Other Topics Concern  ? Not on file  ?Social History Narrative  ?    ? Patient is single, recovering ETOH abuser. Patient has her bachelors degree  Patient has her masters from [employer].. 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   ? Patient is right-handed.  ? Patient does not drink any caffeine.  ?   ?   ?   ? ?Social Determinants of Health  ? ?Financial Resource Strain: Not on file  ?Food Insecurity: Not on file  ?Transportation Needs: Not on file  ?Physical Activity: Not on file  ?Stress: Not on file  ?Social Connections: Not on file  ?Intimate Partner Violence: Not on file  ? ? ? ? ?PHYSICAL EXAM ? ?Vitals:  ? 08/23/21 0933  ?BP: 112/74  ?Pulse: 97  ?Weight: 143 lb 6.4 oz (65 kg)  ?Height: 5\' 3"  (1.6 m)  ? ?Body mass index is 25.4 kg/m?. ? ?Generalized: Well developed, in no acute distress  ? ?Neurological examination  ?Mentation: Alert oriented to time, place, history taking. Follows all commands speech and language fluent ?Cranial nerve II-XII: Pupils were equal round reactive to light. Extraocular movements were full, visual field were full on confrontational test.. Head turning and shoulder shrug  were normal and symmetric. ?Motor: The motor testing reveals 5 over 5 strength of all 4 extremities. Good symmetric motor tone is noted throughout.  ?Sensory: Sensory testing is intact to soft touch on all 4 extremities. No evidence of extinction is noted.  ?Coordination: Cerebellar testing reveals good finger-nose-finger and heel-to-shin bilaterally.  ?Gait and station: Gait is normal. ? ? ?DIAGNOSTIC DATA (LABS, IMAGING, TESTING) ?- I reviewed patient records, labs, notes, testing and imaging myself where available. ? ?Lab Results  ?Component Value Date  ? WBC 5.7 01/09/2019  ?  HGB 12.9 01/09/2019  ? HCT 38.6 01/09/2019  ? MCV 95 01/09/2019  ? PLT 310 01/09/2019  ? ?   ?Component Value Date/Time  ? NA 139 08/11/2020 0907  ? K 4.4 08/11/2020 0907  ? CL 100 08/11/2020 0907  ? CO2 24 08/11/2020 0907  ? GLUCOSE 103 (H) 08/11/2020 10/11/2020  ? BUN 14 08/11/2020 0907  ? CREATININE 1.05 (H) 08/11/2020 10/11/2020  ? CALCIUM 9.7 08/11/2020 0907  ? PROT 7.1 08/11/2020 0907  ? ALBUMIN 4.4 08/11/2020 0907  ? AST 13 08/11/2020 0907  ? ALT 15 08/11/2020 0907  ? ALKPHOS 62 08/11/2020 0907  ? BILITOT 0.3 08/11/2020 0907  ? GFRNONAA 72 02/10/2020 0912  ? GFRAA 83 02/10/2020 0912  ? ? ? ? ? ? ? ?  ASSESSMENT AND PLAN ?41 y.o. year old female  has a past medical history of Controlled narcolepsy (05/27/2013), Depression, EDS (Ehlers-Danlos syndrome), Hypersomnia, persistent, PTSD (post-traumatic stress disorder), and Recovering alcoholic (HCC). here with  ? ?Narcolepsy ?--Continue Xywav ?--Continue Adderall 20 mg in the morning and at noon and a half a tablet at 4 PM ?-- Blood work being completed by her PCP.  ?-- Follow up in 6 months or sooner if needed.  ? ? ?Butch Penny, MSN, NP-C 08/23/2021, 9:38 AM ?[facility] ?[address], [address] ?[place], [place] [address] ?[phone]  ?

## 2021-08-23 NOTE — Patient Instructions (Signed)
Your Plan: ? ?Continue Xywav and adderall ?If your symptoms worsen or you develop new symptoms please let us know.  ? ? ?Thank you for coming to see [place] at [facility]. I hope we have been able to provide you high quality care today. ? ?You may receive a patient satisfaction survey over the next few weeks. We would appreciate your feedback and comments so that we may continue to improve ourselves and the health of our patients. ? ?

## 2021-09-05 ENCOUNTER — Other Ambulatory Visit: Payer: Self-pay | Admitting: Adult Health

## 2021-09-05 MED ORDER — AMPHETAMINE-DEXTROAMPHETAMINE 20 MG PO TABS: ORAL_TABLET | ORAL | 0 refills | Status: DC

## 2021-09-05 NOTE — Telephone Encounter (Signed)
Last visit: 08/23/21  ?Next visit: 03/01/22 ?Per [place] registry, last filled on 08/01/2021 #75/30. Rx refill sent to [facility].  ?

## 2021-09-05 NOTE — Telephone Encounter (Signed)
Pt is requesting a refill for amphetamine-dextroamphetamine (ADDERALL) 20 MG tablet.  [id number]  

## 2021-09-07 ENCOUNTER — Other Ambulatory Visit: Payer: Self-pay | Admitting: Neurology

## 2021-09-07 MED ORDER — XYWAV 500 MG/ML PO SOLN: ORAL | 5 refills | Status: DC

## 2021-09-12 ENCOUNTER — Telehealth: Payer: Self-pay | Admitting: Adult Health

## 2021-09-12 NOTE — Telephone Encounter (Signed)
I called Sherry Rhodes. Pharmacist from [facility].  They did receive prescription needed to clarify dose/ instructions/ provider.  Xywav 500mg /ml solution.  4.5G twice nightly.  (Take 1st dose at bedtime then 2nd dose 2.5-4 hrs later).  #581ml with 5 refills  by Dr. 43m Dohmeier.   ?

## 2021-09-12 NOTE — Telephone Encounter (Signed)
[facility]) Clarify dosage and directions for Ca, Mg, K, and Na Oxybates (XYWAV) 500 MG/ML SOLN . Would like a call from the nurse. ?

## 2021-10-10 ENCOUNTER — Other Ambulatory Visit: Payer: Self-pay | Admitting: Adult Health

## 2021-10-10 NOTE — Telephone Encounter (Signed)
Pt request refill for amphetamine-dextroamphetamine (ADDERALL) 20 MG tablet at [id number]  ?

## 2021-10-11 MED ORDER — AMPHETAMINE-DEXTROAMPHETAMINE 20 MG PO TABS: ORAL_TABLET | ORAL | 0 refills | Status: DC

## 2021-11-22 ENCOUNTER — Other Ambulatory Visit: Payer: Self-pay | Admitting: Adult Health

## 2021-11-22 MED ORDER — AMPHETAMINE-DEXTROAMPHETAMINE 20 MG PO TABS: ORAL_TABLET | ORAL | 0 refills | Status: DC

## 2021-11-22 NOTE — Telephone Encounter (Signed)
I reviewed  drug registry. Last refill was 10/11/2021 # 75 for a 30 day supply. Last f/u  03/01/2022.

## 2021-11-22 NOTE — Telephone Encounter (Signed)
Pt is needing a refill on her amphetamine-dextroamphetamine (ADDERALL) 20 MG tablet sent in to the [facility] on [address]

## 2021-11-23 MED ORDER — AMPHETAMINE-DEXTROAMPHETAMINE 20 MG PO TABS: ORAL_TABLET | ORAL | 0 refills | Status: DC

## 2021-11-23 NOTE — Addendum Note (Signed)
Addended by: Hermenia Fiscal S on: 11/23/2021 12:41 PM   Modules accepted: Orders

## 2021-11-23 NOTE — Telephone Encounter (Signed)
Pt states the [facility] on [address] does not have her amphetamine-dextroamphetamine (ADDERALL) 20 MG tablet she is asking [facility]   [facility] [address]

## 2021-11-23 NOTE — Telephone Encounter (Signed)
Out of this at [facility] normal [facility].  Now needs at [facility] at [address].  [place] drug registry checked last fill 10-11-2021 #75.  Last seen 08-23-2021.

## 2021-11-23 NOTE — Addendum Note (Signed)
Addended by: Guy Begin on: 11/23/2021 02:19 PM   Modules accepted: Orders

## 2021-12-28 ENCOUNTER — Other Ambulatory Visit: Payer: Self-pay | Admitting: Adult Health

## 2021-12-28 MED ORDER — AMPHETAMINE-DEXTROAMPHETAMINE 20 MG PO TABS: ORAL_TABLET | ORAL | 0 refills | Status: DC

## 2021-12-28 NOTE — Telephone Encounter (Signed)
Last visit: 08/23/21 Next visit: 03/01/22 Per [place] registry, last filled on 11/23/2021 #75/30. Rx refill sent to [facility].

## 2021-12-28 NOTE — Telephone Encounter (Signed)
Pt is needing a refill request for her amphetamine-dextroamphetamine (ADDERALL) 20 MG tablet sent in to the [facility] on [address] 

## 2022-02-01 ENCOUNTER — Other Ambulatory Visit: Payer: Self-pay | Admitting: Adult Health

## 2022-02-01 MED ORDER — AMPHETAMINE-DEXTROAMPHETAMINE 20 MG PO TABS: ORAL_TABLET | ORAL | 0 refills | Status: DC

## 2022-02-01 NOTE — Telephone Encounter (Signed)
Last visit 08/23/21 Next visit 03/01/22  Per [place] registry last filled on 12/28/2021 #75/30.

## 2022-02-01 NOTE — Telephone Encounter (Signed)
Pt is requesting a refill for amphetamine-dextroamphetamine (ADDERALL) 20 MG tablet  Pharmacy: [id number]  

## 2022-02-01 NOTE — Addendum Note (Signed)
Addended by: Gildardo Griffes on: 02/01/2022 11:39 AM   Modules accepted: Orders

## 2022-02-02 ENCOUNTER — Telehealth: Payer: Self-pay | Admitting: Adult Health

## 2022-02-02 ENCOUNTER — Other Ambulatory Visit: Payer: Self-pay | Admitting: *Deleted

## 2022-02-02 MED ORDER — AMPHETAMINE-DEXTROAMPHETAMINE 20 MG PO TABS: ORAL_TABLET | ORAL | 0 refills | Status: DC

## 2022-02-02 NOTE — Telephone Encounter (Signed)
Called [facility] and canceled order and sent [facility] [address]

## 2022-02-02 NOTE — Telephone Encounter (Signed)
Sent new order to Dr Brett Fairy for approval this afternoon

## 2022-02-02 NOTE — Telephone Encounter (Signed)
Pt is calling. Stated she need medication amphetamine-dextroamphetamine (ADDERALL) 20 MG tablet [id number] [place] [place] [facility] [address]. AT [address]. [facility] is currently out stock.

## 2022-03-01 ENCOUNTER — Ambulatory Visit: Payer: BC Managed Care – PPO | Admitting: Adult Health

## 2022-03-01 ENCOUNTER — Encounter: Payer: Self-pay | Admitting: Adult Health

## 2022-03-01 VITALS — BP 122/77 | HR 94 | Ht 63.0 in | Wt 137.4 lb

## 2022-03-01 DIAGNOSIS — G47419 Narcolepsy without cataplexy: Secondary | ICD-10-CM

## 2022-03-01 NOTE — Progress Notes (Signed)
PATIENT: Sherry Rhodes DOB: May 21, 1980  REASON FOR VISIT: follow up HISTORY FROM: patient   HISTORY OF PRESENT ILLNESS: Today 03/01/22:  Ms. Sherry Rhodes is a 41 year old female with a history of narcolepsy.  She returns today for follow-up.  She remains on Xywav and Adderall.  The trouble staying awake while working or driving.  No change in her depression.  No suicidal thoughts.  Reports that she Often doesn't need the 3rd dose of adderall.   08/23/21: Ms. Sherry Rhodes is a 41 year old female with a history of narcolepsy.  She returns today for follow-up.  She is currently on Xywav and Adderall.  She reports that the combination continues to work well for her.  Denies any trouble staying awake while at work or driving.  Denies worsening depression.  Returns today for an evaluation.   02/15/21: Ms. Sherry Rhodes is a 41 year old female with a history of narcolepsy.  She returns today for follow-up.  She continues on Xyrem and Adderall.  She reports that this medication continues to work well for her. Patient endorses that the medication continues to work well for her. She denies any excess sleepiness, jitters, decreased appetite, or trouble sleeping. 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 She denies worsening depression or thoughts of hurting herself or others at this time.     HISTORY   08/11/20: Ms. Sherry Rhodes is a 41 year old female with a history of narcolepsy.  She returns today for follow-up.  She continues on Xyrem and Adderall.  She reports that this medication continues to work well for her.  She has a full-time job with no difficulty.  She drives without difficulty.  Denies any worsening of depression.  Denies suicidal thoughts.  She returns today for evaluation.  07/10/19: Ms. Sherry Rhodes is a 41 year old female with a history of narcolepsy.  She returns today for follow-up.  She remains on Xyrem and Adderall.  Reports that these are working well for her.  Denies any  significant change in her depression.  Denies suicidal thoughts.  Denies falling asleep at work or while driving.    REVIEW OF SYSTEMS: Out of a complete 14 system review of symptoms, the patient complains only of the following symptoms, and all other reviewed systems are negative.  ALLERGIES: Allergies  Allergen Reactions   Bee Venom     HOME MEDICATIONS: Outpatient Medications Prior to Visit  Medication Sig Dispense Refill   amphetamine-dextroamphetamine (ADDERALL) 20 MG tablet Take one tablet in AM, one tablet at 12 noon, and one half tablet at 4 pm. 75 tablet 0   buPROPion (WELLBUTRIN XL) 150 MG 24 hr tablet 450 mg.   5   Ca, Mg, K, and Na Oxybates (XYWAV) 500 MG/ML SOLN Take 4.5 G twice nightly 540 mL 5   lamoTRIgine (LAMICTAL) 100 MG tablet TAKE 1 TABLET(100 MG) BY MOUTH DAILY (Patient taking differently: Take 200 mg by mouth 2 (two) times daily. 100mg  BID) 30 tablet 0   levonorgestrel (MIRENA) 20 MCG/24HR IUD 1 each by Intrauterine route once.     loratadine (CLARITIN) 10 MG tablet Take 10 mg by mouth daily.     Vilazodone HCl (VIIBRYD) 40 MG TABS Take 40 mg by mouth daily.     No facility-administered medications prior to visit.    PAST MEDICAL HISTORY: Past Medical History:  Diagnosis Date   Controlled narcolepsy 05/27/2013   Depression    EDS (Ehlers-Danlos syndrome)    persistent excessive daytime sleepiness   Hypersomnia, persistent  MSLT no SREM but lass than 8 minutes SL.    PTSD (post-traumatic stress disorder)    Recovering alcoholic ([place])     PAST SURGICAL HISTORY: Past Surgical History:  Procedure Laterality Date   LASIK Right 6/12   good success- vision correction    FAMILY HISTORY: Family History  Problem Relation Age of Onset   Parkinson's disease Maternal Grandmother    Drug abuse Maternal Grandfather    Cancer Paternal Grandmother    Cancer Paternal Grandfather    Drug abuse Paternal Grandfather    Narcolepsy Neg Hx     SOCIAL  HISTORY: Social History   Socioeconomic History   Marital status: Single    Spouse name: Not on file   Number of children: 0   Years of education: Bachelors   Highest education level: Not on file  Occupational History    Employer: [employer]  Tobacco Use   Smoking status: Former   Smokeless tobacco: Never   Tobacco comments:    quit smoking 3/10  Vaping Use   Vaping Use: Never used  Substance and Sexual Activity   Alcohol use: No    Comment: quit alcohol 02/2003-recovering alcohol abuser   Drug use: No   Sexual activity: Not on file  Other Topics Concern   Not on file  Social History Narrative       Patient is single, recovering ETOH abuser. Patient has her bachelors degree  Patient has her masters from [employer].. 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    Patient is right-handed.   Patient does not drink any caffeine.            Social Determinants of Health   Financial Resource Strain: Not on file  Food Insecurity: Not on file  Transportation Needs: Not on file  Physical Activity: Not on file  Stress: Not on file  Social Connections: Not on file  Intimate Partner Violence: Not on file      PHYSICAL EXAM  Vitals:   03/01/22 1002  BP: 122/77  Pulse: 94  Weight: 137 lb 6.4 oz (62.3 kg)  Height: 5\' 3"  (1.6 m)   Body mass index is 24.34 kg/m.  Generalized: Well developed, in no acute distress   Neurological examination  Mentation: Alert oriented to time, place, history taking. Follows all commands speech and language fluent Cranial nerve II-XII: Pupils were equal round reactive to light. Extraocular movements were full, visual field were full on confrontational test.. Head turning and shoulder shrug  were normal and symmetric. Motor: The motor testing reveals 5 over 5 strength of all 4 extremities. Good symmetric motor tone is noted throughout.  Sensory: Sensory testing is intact to soft touch on all 4 extremities. No evidence of extinction is noted.   Coordination: Cerebellar testing reveals good finger-nose-finger and heel-to-shin bilaterally.  Gait and station: Gait is normal.   DIAGNOSTIC DATA (LABS, IMAGING, TESTING) - I reviewed patient records, labs, notes, testing and imaging myself where available.  Lab Results  Component Value Date   WBC 5.7 01/09/2019   HGB 12.9 01/09/2019   HCT 38.6 01/09/2019   MCV 95 01/09/2019   PLT 310 01/09/2019      Component Value Date/Time   NA 139 08/11/2020 0907   K 4.4 08/11/2020 0907   CL 100 08/11/2020 0907   CO2 24 08/11/2020 0907   GLUCOSE 103 (H) 08/11/2020 0907   BUN 14 08/11/2020 0907   CREATININE 1.05 (H) 08/11/2020 0907   CALCIUM 9.7 08/11/2020 0907  PROT 7.1 08/11/2020 0907   ALBUMIN 4.4 08/11/2020 0907   AST 13 08/11/2020 0907   ALT 15 08/11/2020 0907   ALKPHOS 62 08/11/2020 0907   BILITOT 0.3 08/11/2020 0907   GFRNONAA 72 02/10/2020 0912   GFRAA 83 02/10/2020 0912         ASSESSMENT AND PLAN 41 y.o. year old female  has a past medical history of Controlled narcolepsy (05/27/2013), Depression, EDS (Ehlers-Danlos syndrome), Hypersomnia, persistent, PTSD (post-traumatic stress disorder), and Recovering alcoholic (HCC). here with   Narcolepsy --Continue Xywav --Continue Adderall 20 mg in the morning and at noon and a half a tablet at 4 PM -- Blood work being completed by her PCP.  -- Follow up in 6 months or sooner if needed.    Butch Penny, MSN, NP-C 03/01/2022, 10:17 AM [facility] [address], [address] [place], [place] [address] [phone]

## 2022-03-01 NOTE — Patient Instructions (Signed)
Your Plan: ? ?Continue ? ? ? ? ?Thank you for coming to see us at [facility]. I hope we have been able to provide you high quality care today. ? ?You may receive a patient satisfaction survey over the next few weeks. We would appreciate your feedback and comments so that we may continue to improve ourselves and the health of our patients. ? ?

## 2022-03-07 ENCOUNTER — Other Ambulatory Visit: Payer: Self-pay | Admitting: Neurology

## 2022-03-07 MED ORDER — XYWAV 500 MG/ML PO SOLN: ORAL | 5 refills | Status: DC

## 2022-03-13 ENCOUNTER — Other Ambulatory Visit: Payer: Self-pay | Admitting: Adult Health

## 2022-03-13 MED ORDER — AMPHETAMINE-DEXTROAMPHETAMINE 20 MG PO TABS: ORAL_TABLET | ORAL | 0 refills | Status: DC

## 2022-03-13 NOTE — Telephone Encounter (Signed)
Pt is calling.  Requesting a refill on medication amphetamine-dextroamphetamine (ADDERALL) 20 MG table. [id number]

## 2022-04-17 ENCOUNTER — Other Ambulatory Visit: Payer: Self-pay | Admitting: Adult Health

## 2022-04-17 NOTE — Telephone Encounter (Signed)
Last visit 03/01/22  Next visit 09/05/22  Per [place] registry, last filled on 03/13/2022 #75/30. Rx refill sent to [facility].

## 2022-04-17 NOTE — Telephone Encounter (Signed)
Pt is requesting a refill for amphetamine-dextroamphetamine (ADDERALL) 20 MG tablet.  [phone]

## 2022-04-18 MED ORDER — AMPHETAMINE-DEXTROAMPHETAMINE 20 MG PO TABS: ORAL_TABLET | ORAL | 0 refills | Status: DC

## 2022-04-19 ENCOUNTER — Telehealth: Payer: Self-pay | Admitting: *Deleted

## 2022-04-19 NOTE — Telephone Encounter (Signed)
Gillermina Phy PA completed on Cover My Meds for idopathic hypersomnolence (see tele note from 05/2020). Key: [id number]. Also faxed medical records to cover my meds. Received a receipt of confirmation. Awaiting determination from [employer].

## 2022-04-26 NOTE — Telephone Encounter (Signed)
Per CMM, Trudee Kuster has been approved effective from 04/19/2022 through 04/18/2023.

## 2022-04-26 NOTE — Telephone Encounter (Signed)
We received another PA for Xywav 500 mg/mL asking for additional information. Cover My Meds says its approved through 04/18/23. I called [employer] and spoke with Salvadore Dom and confirmed it is indeed approved and no further action is needed on our part.

## 2022-05-26 DIAGNOSIS — Z0000 Encounter for general adult medical examination without abnormal findings: Secondary | ICD-10-CM | POA: Diagnosis not present

## 2022-05-29 ENCOUNTER — Other Ambulatory Visit: Payer: Self-pay | Admitting: Adult Health

## 2022-05-29 MED ORDER — AMPHETAMINE-DEXTROAMPHETAMINE 20 MG PO TABS: ORAL_TABLET | ORAL | 0 refills | Status: DC

## 2022-05-29 NOTE — Telephone Encounter (Signed)
Pt is calling. Requesting a refill on amphetamine-dextroamphetamine (ADDERALL) 20 MG tablet. [id number]

## 2022-05-29 NOTE — Telephone Encounter (Signed)
Last visit 03/01/22 Next visit 09/05/22  Per [place] registry, last filled on 04/18/2022 # 75/30. Refill request sent to [facility].

## 2022-05-31 DIAGNOSIS — F3342 Major depressive disorder, recurrent, in full remission: Secondary | ICD-10-CM | POA: Diagnosis not present

## 2022-05-31 DIAGNOSIS — Z13220 Encounter for screening for lipoid disorders: Secondary | ICD-10-CM | POA: Diagnosis not present

## 2022-05-31 DIAGNOSIS — Z0000 Encounter for general adult medical examination without abnormal findings: Secondary | ICD-10-CM | POA: Diagnosis not present

## 2022-06-29 ENCOUNTER — Other Ambulatory Visit: Payer: Self-pay | Admitting: Adult Health

## 2022-06-29 NOTE — Addendum Note (Signed)
Addended by: Gildardo Griffes on: 06/29/2022 03:32 PM   Modules accepted: Orders

## 2022-06-29 NOTE — Telephone Encounter (Signed)
Pt called needing her amphetamine-dextroamphetamine (ADDERALL) 20 MG tablet [facility] [address]

## 2022-06-29 NOTE — Telephone Encounter (Signed)
Last visit 03/01/22 Next visit 09/05/22    Rx request sent to [facility]

## 2022-06-30 MED ORDER — AMPHETAMINE-DEXTROAMPHETAMINE 20 MG PO TABS: ORAL_TABLET | ORAL | 0 refills | Status: DC

## 2022-08-08 ENCOUNTER — Other Ambulatory Visit: Payer: Self-pay | Admitting: Adult Health

## 2022-08-08 MED ORDER — AMPHETAMINE-DEXTROAMPHETAMINE 20 MG PO TABS: ORAL_TABLET | ORAL | 0 refills | Status: DC

## 2022-08-08 NOTE — Telephone Encounter (Signed)
Last seen 03/01/22 Next visit 09/05/22  Per [place] registry:    Rx refill sent to Dr Brett Fairy Jinny Blossom NP out of office)

## 2022-08-08 NOTE — Telephone Encounter (Signed)
Pt called requesting a refill for her amphetamine-dextroamphetamine (ADDERALL) 20 MG tablet sent to [facility] on [address]

## 2022-08-10 ENCOUNTER — Other Ambulatory Visit: Payer: Self-pay | Admitting: Neurology

## 2022-08-10 MED ORDER — XYWAV 500 MG/ML PO SOLN: ORAL | 5 refills | Status: DC

## 2022-09-05 ENCOUNTER — Telehealth: Payer: BC Managed Care – PPO | Admitting: Adult Health

## 2022-09-05 DIAGNOSIS — G47419 Narcolepsy without cataplexy: Secondary | ICD-10-CM

## 2022-09-05 NOTE — Progress Notes (Signed)
PATIENT: Sherry Rhodes DOB: 1980-11-06  REASON FOR VISIT: follow up HISTORY FROM: patient  Virtual Visit via Video Note  I connected with [name] on 09/05/22 at  3:00 PM EDT by a video enabled telemedicine application located remotely at [facility] and verified that I am speaking with the correct person using two identifiers who was located at their own home.   I discussed the limitations of evaluation and management by telemedicine and the availability of in person appointments. The patient expressed understanding and agreed to proceed.   PATIENT: Sherry Rhodes DOB: 1980-10-24  REASON FOR VISIT: follow up HISTORY FROM: patient  HISTORY OF PRESENT ILLNESS: Today 09/05/22:  Sherry Rhodes is a 42 y.o. female with a history of Narcolepsy. Returns today for follow-up.  Continues on Xywav and Adderall.  Reports that this is working well.  She is able to work and drive without difficulty.  Denies any cataplectic events.  No change in her mood or behavior.  No suicidal thoughts.     03/01/22: Sherry Rhodes is a 42 year old female with a history of narcolepsy.  She returns today for follow-up.  She remains on Xywav and Adderall.  The trouble staying awake while working or driving.  No change in her depression.  No suicidal thoughts.  Reports that she Often doesn't need the 3rd dose of adderall.    08/23/21: Sherry Rhodes is a 42 year old female with a history of narcolepsy.  She returns today for follow-up.  She is currently on Xywav and Adderall.  She reports that the combination continues to work well for her.  Denies any trouble staying awake while at work or driving.  Denies worsening depression.  Returns today for an evaluation.     02/15/21: Sherry Rhodes is a 42 year old female with a history of narcolepsy.  She returns today for follow-up.  She continues on Xyrem and Adderall.  She reports that this medication continues to work well for her. Patient  endorses that the medication continues to work well for her. She denies any excess sleepiness, jitters, decreased appetite, or trouble sleeping. 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 She denies worsening depression or thoughts of hurting herself or others at this time.      08/11/20: Sherry Rhodes is a 42 year old female with a history of narcolepsy.  She returns today for follow-up.  She continues on Xyrem and Adderall.  She reports that this medication continues to work well for her.  She has a full-time job with no difficulty.  She drives without difficulty.  Denies any worsening of depression.  Denies suicidal thoughts.  She returns today for evaluation.   07/10/19: Sherry Rhodes is a 42 year old female with a history of narcolepsy.  She returns today for follow-up.  She remains on Xyrem and Adderall.  Reports that these are working well for her.  Denies any significant change in her depression.  Denies suicidal thoughts.  Denies falling asleep at work or while driving.  REVIEW OF SYSTEMS: Out of a complete 14 system review of symptoms, the patient complains only of the following symptoms, and all other reviewed systems are negative.  ALLERGIES: Allergies  Allergen Reactions   Bee Venom     HOME MEDICATIONS: Outpatient Medications Prior to Visit  Medication Sig Dispense Refill   amphetamine-dextroamphetamine (ADDERALL) 20 MG tablet Take one tablet in AM, one tablet at 12 noon, and one half tablet at 4 pm. 75 tablet 0  buPROPion (WELLBUTRIN XL) 150 MG 24 hr tablet 450 mg.   5   Ca, Mg, K, and Na Oxybates (XYWAV) 500 MG/ML SOLN Take 4.5 G twice nightly 540 mL 5   lamoTRIgine (LAMICTAL) 100 MG tablet TAKE 1 TABLET(100 MG) BY MOUTH DAILY (Patient taking differently: Take 200 mg by mouth 2 (two) times daily. 100mg  BID) 30 tablet 0   levonorgestrel (MIRENA) 20 MCG/24HR IUD 1 each by Intrauterine route once.     loratadine (CLARITIN) 10 MG tablet Take 10 mg by mouth  daily.     Vilazodone HCl (VIIBRYD) 40 MG TABS Take 40 mg by mouth daily.     No facility-administered medications prior to visit.    PAST MEDICAL HISTORY: Past Medical History:  Diagnosis Date   Controlled narcolepsy 05/27/2013   Depression    EDS (Ehlers-Danlos syndrome)    persistent excessive daytime sleepiness   Hypersomnia, persistent    MSLT no SREM but lass than 8 minutes SL.    PTSD (post-traumatic stress disorder)    Recovering alcoholic (HCC)     PAST SURGICAL HISTORY: Past Surgical History:  Procedure Laterality Date   LASIK Right 6/12   good success- vision correction    FAMILY HISTORY: Family History  Problem Relation Age of Onset   Parkinson's disease Maternal Grandmother    Drug abuse Maternal Grandfather    Cancer Paternal Grandmother    Cancer Paternal Grandfather    Drug abuse Paternal Grandfather    Narcolepsy Neg Hx     SOCIAL HISTORY: Social History   Socioeconomic History   Marital status: Single    Spouse name: Not on file   Number of children: 0   Years of education: Bachelors   Highest education level: Not on file  Occupational History    Employer: [employer]  Tobacco Use   Smoking status: Former   Smokeless tobacco: Never   Tobacco comments:    quit smoking 3/10  Vaping Use   Vaping Use: Never used  Substance and Sexual Activity   Alcohol use: No    Comment: quit alcohol 02/2003-recovering alcohol abuser   Drug use: No   Sexual activity: Not on file  Other Topics Concern   Not on file  Social History Narrative       Patient is single, recovering ETOH abuser. Patient has her bachelors degree  Patient has her masters from [employer].. 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    Patient is right-handed.   Patient does not drink any caffeine.            Social Determinants of Health   Financial Resource Strain: Not on file  Food Insecurity: Not on file  Transportation Needs: Not on file  Physical Activity: Not on file   Stress: Not on file  Social Connections: Not on file  Intimate Partner Violence: Not on file      PHYSICAL EXAM Generalized: Well developed, in no acute distress   Neurological examination  Mentation: Alert oriented to time, place, history taking. Follows all commands speech and language fluent Cranial nerve II-XII: Facial symmetry noted  DIAGNOSTIC DATA (LABS, IMAGING, TESTING) - I reviewed patient records, labs, notes, testing and imaging myself where available.  Lab Results  Component Value Date   WBC 5.7 01/09/2019   HGB 12.9 01/09/2019   HCT 38.6 01/09/2019   MCV 95 01/09/2019   PLT 310 01/09/2019      Component Value Date/Time   NA 139 08/11/2020 0907   K  4.4 08/11/2020 0907   CL 100 08/11/2020 0907   CO2 24 08/11/2020 0907   GLUCOSE 103 (H) 08/11/2020 0907   BUN 14 08/11/2020 0907   CREATININE 1.05 (H) 08/11/2020 0907   CALCIUM 9.7 08/11/2020 0907   PROT 7.1 08/11/2020 0907   ALBUMIN 4.4 08/11/2020 0907   AST 13 08/11/2020 0907   ALT 15 08/11/2020 0907   ALKPHOS 62 08/11/2020 0907   BILITOT 0.3 08/11/2020 0907   GFRNONAA 72 02/10/2020 0912   GFRAA 83 02/10/2020 0912      ASSESSMENT AND PLAN 42 y.o. year old female  has a past medical history of Controlled narcolepsy (05/27/2013), Depression, EDS (Ehlers-Danlos syndrome), Hypersomnia, persistent, PTSD (post-traumatic stress disorder), and Recovering alcoholic (HCC). here with:   Narcolepsy   --Continue Xywav --Continue Adderall 20 mg in the morning and at noon and a half a tablet at 4 PM -- Blood work being completed by her PCP.  -- Follow up in 6 months or sooner if needed.      Butch Penny, MSN, NP-C 09/05/2022, 3:02 PM [facility] [address] [place], [place] [address] [phone]

## 2022-09-13 ENCOUNTER — Other Ambulatory Visit: Payer: Self-pay | Admitting: Adult Health

## 2022-09-13 MED ORDER — AMPHETAMINE-DEXTROAMPHETAMINE 20 MG PO TABS: ORAL_TABLET | ORAL | 0 refills | Status: DC

## 2022-09-13 NOTE — Telephone Encounter (Signed)
Pt is requesting a refill for amphetamine-dextroamphetamine (ADDERALL) 20 MG tablet.  Pharmacy: [facility] #[id number]   

## 2022-09-13 NOTE — Telephone Encounter (Signed)
Last visit 09/05/22 Next visit 04/03/23  Per [place] registry:    Rx refill sent to MM NP

## 2022-10-23 ENCOUNTER — Other Ambulatory Visit: Payer: Self-pay | Admitting: Adult Health

## 2022-10-23 NOTE — Telephone Encounter (Signed)
Patient is due for a refill on adderall. [place] Controlled Substance Registry checked and is appropriate. Patient is up to date on her appointments.  Will send to Dr. Vickey Huger in [place], NP's absence.

## 2022-10-23 NOTE — Telephone Encounter (Signed)
Pt requesting refill on amphetamine-dextroamphetamine (ADDERALL) 20 MG tablet. Should be sent to [facility] #[id number]

## 2022-10-24 MED ORDER — AMPHETAMINE-DEXTROAMPHETAMINE 20 MG PO TABS: ORAL_TABLET | ORAL | 0 refills | Status: DC

## 2022-10-24 NOTE — Telephone Encounter (Signed)
Morrie Sheldon from [employer] called requesting an approval for the pt to receive her  Ca, Mg, K, and Na Oxybates (XYWAV) 500 MG/ML SOLN a day early. Morrie Sheldon states that the pt is scheduled to receive the medication on Wednesday the 26th but pt is going out of town and is requesting to receive the medication Tuesday the 25th. Please advise.

## 2022-10-25 NOTE — Telephone Encounter (Signed)
Pt called refill for Adderall was sent the wrong pharmacy.  Pt requesting refill on amphetamine-dextroamphetamine (ADDERALL) 20 MG tablet. Should be sent to [facility] #[id number]

## 2022-10-26 ENCOUNTER — Telehealth: Payer: Self-pay | Admitting: *Deleted

## 2022-10-26 NOTE — Telephone Encounter (Signed)
Morrie Sheldon calling for early 5 day refill for Trudee Kuster since will be OOT.  Ok'd this.

## 2022-10-27 ENCOUNTER — Telehealth: Payer: Self-pay | Admitting: Adult Health

## 2022-10-27 MED ORDER — XYWAV 500 MG/ML PO SOLN: ORAL | 5 refills | Status: DC

## 2022-10-27 MED ORDER — AMPHETAMINE-DEXTROAMPHETAMINE 20 MG PO TABS: ORAL_TABLET | ORAL | 0 refills | Status: DC

## 2022-10-27 NOTE — Telephone Encounter (Signed)
[facility] for [employer] refill ? Wrong pharmacy for Adderall and  early refill?   I sent adderall to the [facility] [phone]   For an early Xyrem refill, we just have to call the dispensing pharmacy ? And which one is that - [facility]

## 2022-10-27 NOTE — Telephone Encounter (Signed)
Pt requested refill for  amphetamine-dextroamphetamine (ADDERALL) 20 MG tablet on Monday, 10/23/22. Was sent to the wrong pharmacy. Pt called back on the Wednesday, 10/25/22 requesting refill be sent to correct  pharmacy,[facility] #[id number]. Pharmacy does not have refill request. Pt is out of medication. Pt need refill sent to [facility] #[id number]

## 2022-10-30 NOTE — Telephone Encounter (Signed)
[phone] ph for Xywav REMS. Spoke to [place] at pharmacy.

## 2022-10-30 NOTE — Telephone Encounter (Signed)
See other note

## 2022-11-08 DIAGNOSIS — Z1231 Encounter for screening mammogram for malignant neoplasm of breast: Secondary | ICD-10-CM | POA: Diagnosis not present

## 2022-11-08 DIAGNOSIS — Z124 Encounter for screening for malignant neoplasm of cervix: Secondary | ICD-10-CM | POA: Diagnosis not present

## 2022-11-08 DIAGNOSIS — Z01419 Encounter for gynecological examination (general) (routine) without abnormal findings: Secondary | ICD-10-CM | POA: Diagnosis not present

## 2022-11-22 DIAGNOSIS — N6002 Solitary cyst of left breast: Secondary | ICD-10-CM | POA: Diagnosis not present

## 2022-11-22 DIAGNOSIS — R928 Other abnormal and inconclusive findings on diagnostic imaging of breast: Secondary | ICD-10-CM | POA: Diagnosis not present

## 2022-11-28 ENCOUNTER — Other Ambulatory Visit: Payer: Self-pay | Admitting: Adult Health

## 2022-11-28 MED ORDER — AMPHETAMINE-DEXTROAMPHETAMINE 20 MG PO TABS: ORAL_TABLET | ORAL | 0 refills | Status: DC

## 2022-11-28 NOTE — Telephone Encounter (Signed)
[place] drug registry checked, last filled 10/27/22. Pt last seen 09/05/22. Has upcoming appt 04/02/33. Refills is appropriate.

## 2022-11-28 NOTE — Telephone Encounter (Signed)
Pt request refill for amphetamine-dextroamphetamine (ADDERALL) 20 MG tablet send to  [facility] #[id number]

## 2023-01-02 ENCOUNTER — Telehealth: Payer: Self-pay | Admitting: Adult Health

## 2023-01-02 MED ORDER — AMPHETAMINE-DEXTROAMPHETAMINE 20 MG PO TABS: ORAL_TABLET | ORAL | 0 refills | Status: DC

## 2023-01-02 NOTE — Telephone Encounter (Signed)
Pt request refill for amphetamine-dextroamphetamine (ADDERALL) 20 MG tablet sent to [facility] #[id number]

## 2023-01-02 NOTE — Telephone Encounter (Signed)
Last appointment: 09/05/2022 Next appointment: 04/03/2023

## 2023-01-16 ENCOUNTER — Other Ambulatory Visit: Payer: Self-pay | Admitting: Neurology

## 2023-01-16 MED ORDER — XYWAV 500 MG/ML PO SOLN: ORAL | 2 refills | Status: DC

## 2023-02-05 ENCOUNTER — Other Ambulatory Visit: Payer: Self-pay | Admitting: Adult Health

## 2023-02-05 MED ORDER — AMPHETAMINE-DEXTROAMPHETAMINE 20 MG PO TABS: ORAL_TABLET | ORAL | 0 refills | Status: DC

## 2023-02-05 NOTE — Telephone Encounter (Signed)
Pt request refill for amphetamine-dextroamphetamine (ADDERALL) 20 MG tablet sent to [facility] #[id number]

## 2023-02-05 NOTE — Telephone Encounter (Signed)
Last appointment: 09/05/2022 Next appointment: 04/03/2023

## 2023-03-14 DIAGNOSIS — H3561 Retinal hemorrhage, right eye: Secondary | ICD-10-CM | POA: Diagnosis not present

## 2023-03-22 ENCOUNTER — Other Ambulatory Visit: Payer: Self-pay | Admitting: Adult Health

## 2023-03-22 MED ORDER — AMPHETAMINE-DEXTROAMPHETAMINE 20 MG PO TABS: ORAL_TABLET | ORAL | 0 refills | Status: DC

## 2023-03-22 NOTE — Telephone Encounter (Signed)
Pt request refill for amphetamine-dextroamphetamine (ADDERALL) 20 MG table send to [facility] #[id number]

## 2023-03-22 NOTE — Telephone Encounter (Signed)
Last visit: 09/05/22  Next visit: 04/03/23  Per epic adherence report, last fill:   Rx refill sent to Dr Vickey Huger to [facility].  I called [facility] pharmacy and found out that patient filled this prescription last on 02/08/2023.

## 2023-03-29 ENCOUNTER — Telehealth: Payer: Self-pay

## 2023-03-29 ENCOUNTER — Telehealth: Payer: Self-pay | Admitting: Adult Health

## 2023-03-29 ENCOUNTER — Other Ambulatory Visit (HOSPITAL_COMMUNITY): Payer: Self-pay

## 2023-03-29 NOTE — Telephone Encounter (Signed)
FYI: [facility] ([place]) requesting a PA for Ca, Mg, K, and Na Oxybates (XYWAV) 500 MG/ML SOLN  Current PA expires 04/18/23. Key number for CoverMyMeds: [id number]

## 2023-03-29 NOTE — Telephone Encounter (Signed)
PA request has been Submitted. New Encounter created for follow up. For additional info see Pharmacy Prior Auth telephone encounter from 03/29/2023.

## 2023-03-29 NOTE — Telephone Encounter (Signed)
Pharmacy Patient Advocate Encounter   Received notification from Physician's Office that prior authorization for Xywav 500MG /ML solution is required/requested.   Insurance verification completed.   The patient is insured through [facility] .   Per test claim: PA required; PA started via CoverMyMeds. KEY [id number] . Waiting for clinical questions to populate.

## 2023-04-03 ENCOUNTER — Telehealth: Payer: BC Managed Care – PPO | Admitting: Adult Health

## 2023-04-03 DIAGNOSIS — G47419 Narcolepsy without cataplexy: Secondary | ICD-10-CM

## 2023-04-03 NOTE — Progress Notes (Signed)
PATIENT: Sherry Rhodes DOB: Dec 03, 1980  REASON FOR VISIT: follow up HISTORY FROM: patient  Virtual Visit via Video Note  I connected with Sherry Rhodes on 04/03/23 at  3:00 PM EST by a video enabled telemedicine application located remotely at [facility] and verified that I am speaking with the correct person using two identifiers who was located at their own home.   I discussed the limitations of evaluation and management by telemedicine and the availability of in person appointments. The patient expressed understanding and agreed to proceed.   PATIENT: Sherry Rhodes DOB: 02/15/1981  REASON FOR VISIT: follow up HISTORY FROM: patient  HISTORY OF PRESENT ILLNESS: Today 04/03/23:  Sherry Rhodes is a 42 y.o. female with a history of narcolepsy. Returns today for follow-up.  She remains on Xywav and Adderall.  The combination continues to work well for her.  Denies any cataplectic events.  No change in her mood or behavior.  No suicidal thoughts.  09/05/22: Sherry Rhodes is a 42 y.o. female with a history of Narcolepsy. Returns today for follow-up.  Continues on Xywav and Adderall.  Reports that this is working well.  She is able to work and drive without difficulty.  Denies any cataplectic events.  No change in her mood or behavior.  No suicidal thoughts.  03/01/22: Sherry Rhodes is a 42 year old female with a history of narcolepsy.  She returns today for follow-up.  She remains on Xywav and Adderall.  The trouble staying awake while working or driving.  No change in her depression.  No suicidal thoughts.  Reports that she Often doesn't need the 3rd dose of adderall.    08/23/21: Sherry Rhodes is a 42 year old female with a history of narcolepsy.  She returns today for follow-up.  She is currently on Xywav and Adderall.  She reports that the combination continues to work well for her.  Denies any trouble staying awake while at work or driving.  Denies worsening  depression.  Returns today for an evaluation.     02/15/21: Sherry Rhodes is a 42 year old female with a history of narcolepsy.  She returns today for follow-up.  She continues on Xyrem and Adderall.  She reports that this medication continues to work well for her. Patient endorses that the medication continues to work well for her. She denies any excess sleepiness, jitters, decreased appetite, or trouble sleeping. 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 She denies worsening depression or thoughts of hurting herself or others at this time.      08/11/20: Sherry Rhodes is a 42 year old female with a history of narcolepsy.  She returns today for follow-up.  She continues on Xyrem and Adderall.  She reports that this medication continues to work well for her.  She has a full-time job with no difficulty.  She drives without difficulty.  Denies any worsening of depression.  Denies suicidal thoughts.  She returns today for evaluation.   07/10/19: Sherry Rhodes is a 42 year old female with a history of narcolepsy.  She returns today for follow-up.  She remains on Xyrem and Adderall.  Reports that these are working well for her.  Denies any significant change in her depression.  Denies suicidal thoughts.  Denies falling asleep at work or while driving.  REVIEW OF SYSTEMS: Out of a complete 14 system review of symptoms, the patient complains only of the following symptoms, and all other reviewed systems are negative.  ALLERGIES: Allergies  Allergen Reactions  Bee Venom     HOME MEDICATIONS: Outpatient Medications Prior to Visit  Medication Sig Dispense Refill   amphetamine-dextroamphetamine (ADDERALL) 20 MG tablet Take one tablet in AM, one tablet at 12 noon, and one half tablet at 4 pm. 75 tablet 0   buPROPion (WELLBUTRIN XL) 150 MG 24 hr tablet 450 mg.   5   Ca, Mg, K, and Na Oxybates (XYWAV) 500 MG/ML SOLN Take 4.5 G twice nightly 540 mL 2   lamoTRIgine (LAMICTAL) 100 MG  tablet TAKE 1 TABLET(100 MG) BY MOUTH DAILY (Patient taking differently: Take 200 mg by mouth 2 (two) times daily. 100mg  BID) 30 tablet 0   levonorgestrel (MIRENA) 20 MCG/24HR IUD 1 each by Intrauterine route once.     loratadine (CLARITIN) 10 MG tablet Take 10 mg by mouth daily.     Vilazodone HCl (VIIBRYD) 40 MG TABS Take 40 mg by mouth daily.     No facility-administered medications prior to visit.    PAST MEDICAL HISTORY: Past Medical History:  Diagnosis Date   Controlled narcolepsy 05/27/2013   Depression    EDS (Ehlers-Danlos syndrome)    persistent excessive daytime sleepiness   Hypersomnia, persistent    MSLT no SREM but lass than 8 minutes SL.    PTSD (post-traumatic stress disorder)    Recovering alcoholic (HCC)     PAST SURGICAL HISTORY: Past Surgical History:  Procedure Laterality Date   LASIK Right 6/12   good success- vision correction    FAMILY HISTORY: Family History  Problem Relation Age of Onset   Parkinson's disease Maternal Grandmother    Drug abuse Maternal Grandfather    Cancer Paternal Grandmother    Cancer Paternal Grandfather    Drug abuse Paternal Grandfather    Narcolepsy Neg Hx     SOCIAL HISTORY: Social History   Socioeconomic History   Marital status: Single    Spouse name: Not on file   Number of children: 0   Years of education: Bachelors   Highest education level: Not on file  Occupational History    Employer: [employer]  Tobacco Use   Smoking status: Former   Smokeless tobacco: Never   Tobacco comments:    quit smoking 3/10  Vaping Use   Vaping status: Never Used  Substance and Sexual Activity   Alcohol use: No    Comment: quit alcohol 02/2003-recovering alcohol abuser   Drug use: No   Sexual activity: Not on file  Other Topics Concern   Not on file  Social History Narrative       Patient is single, recovering ETOH abuser. Patient has her bachelors degree  Patient has her masters from [employer].. Patient works for  [employer].    Patient is right-handed.   Patient does not drink any caffeine.            Social Determinants of Health   Financial Resource Strain: Not on file  Food Insecurity: Not on file  Transportation Needs: Not on file  Physical Activity: Not on file  Stress: Not on file  Social Connections: Unknown (11/15/2022)   Received from [facility]   Social Network    Social Network: Not on file  Intimate Partner Violence: Unknown (11/15/2022)   Received from [facility]   HITS    Physically Hurt: Not on file    Insult or Talk Down To: Not on file    Threaten Physical Harm: Not on file    Scream or Curse: Not on  file      PHYSICAL EXAM Generalized: Well developed, in no acute distress   Neurological examination  Mentation: Alert oriented to time, place, history taking. Follows all commands speech and language fluent Cranial nerve II-XII: Facial symmetry noted  DIAGNOSTIC DATA (LABS, IMAGING, TESTING) - I reviewed patient records, labs, notes, testing and imaging myself where available.  Lab Results  Component Value Date   WBC 5.7 01/09/2019   HGB 12.9 01/09/2019   HCT 38.6 01/09/2019   MCV 95 01/09/2019   PLT 310 01/09/2019      Component Value Date/Time   NA 139 08/11/2020 0907   K 4.4 08/11/2020 0907   CL 100 08/11/2020 0907   CO2 24 08/11/2020 0907   GLUCOSE 103 (H) 08/11/2020 0907   BUN 14 08/11/2020 0907   CREATININE 1.05 (H) 08/11/2020 0907   CALCIUM 9.7 08/11/2020 0907   PROT 7.1 08/11/2020 0907   ALBUMIN 4.4 08/11/2020 0907   AST 13 08/11/2020 0907   ALT 15 08/11/2020 0907   ALKPHOS 62 08/11/2020 0907   BILITOT 0.3 08/11/2020 0907   GFRNONAA 72 02/10/2020 0912   GFRAA 83 02/10/2020 0912      ASSESSMENT AND PLAN 42 y.o. year old female  has a past medical history of Controlled narcolepsy (05/27/2013), Depression, EDS (Ehlers-Danlos syndrome), Hypersomnia, persistent, PTSD (post-traumatic stress disorder), and Recovering  alcoholic (HCC). here with:   Narcolepsy   --Continue Xywav --Continue Adderall 20 mg in the morning and at noon and a half a tablet at 4 PM -- Blood work being completed by her PCP.  -- Follow up in 6 months or sooner if needed.      Butch Penny, MSN, NP-C 04/03/2023, 2:37 PM [facility] [address] [place], [place] [address] [phone]

## 2023-04-09 NOTE — Telephone Encounter (Signed)
Please advise 

## 2023-04-09 NOTE — Telephone Encounter (Signed)
Questions expired-Resubmitted BMW:[id number]

## 2023-04-09 NOTE — Telephone Encounter (Signed)
Excessive daytime sleepiness in narcolepsy

## 2023-04-10 NOTE — Telephone Encounter (Signed)
Received a call from Jonny Ruiz at [facility] stating that this prior Berkley Harvey was denied by the medical director. A faxed confirmation of the denial along with explanation is going to be faxed to our office.

## 2023-04-10 NOTE — Telephone Encounter (Signed)
I believe form the previous note that the determination will be faxed to your actual office and not to me-Maybe if someone can share it unless it gets faxed to me with [employer] then it may be a while before I come across it. However after seeing thisa I went in further in the chart and wondering if its the diagnosis?

## 2023-04-11 NOTE — Telephone Encounter (Signed)
Reviewing this, I would say that diagnosis "idiopathic hypersomnia" is what was used for this medication previously.  Ok to use.

## 2023-04-12 NOTE — Telephone Encounter (Signed)
[place] from [facility] is asking for a call to discuss the PA for pt's Xywav 500MG /ML solution, [place] can be reached at [phone]

## 2023-04-16 ENCOUNTER — Other Ambulatory Visit (HOSPITAL_COMMUNITY): Payer: Self-pay

## 2023-04-16 NOTE — Telephone Encounter (Signed)
Pharmacy Patient Advocate Encounter   Received notification from Physician's Office that prior authorization for Xywav 500MG /ML solution is required/requested.   Insurance verification completed.   The patient is insured through [facility] .   Per test claim: PA required; PA submitted to above mentioned insurance via CoverMyMeds Key/confirmation #/EOC [id number] Status is pending

## 2023-04-16 NOTE — Telephone Encounter (Signed)
I called [place] back at [facility] and explained that we received a denial-I am still unsure of why it was denied-denial was to be sent to the [facility] office. They gave me a CMM key and I will try to resubmit with the Idiopathic Hypersomnia DX G47.11.

## 2023-04-17 ENCOUNTER — Other Ambulatory Visit (HOSPITAL_COMMUNITY): Payer: Self-pay

## 2023-04-17 NOTE — Telephone Encounter (Signed)
I called [facility] pharmacy, spoke with [place], and notified of approval dates and reference # with [employer]. She updated the pt's file there.

## 2023-04-17 NOTE — Telephone Encounter (Signed)
Pharmacy Patient Advocate Encounter  Received notification from [facility] that Prior Authorization for Xywav 500MG /ML solution has been APPROVED from 04/16/2023 to 04/15/2024   PA #/Case ID/Reference #: [id number]

## 2023-04-24 ENCOUNTER — Other Ambulatory Visit: Payer: Self-pay | Admitting: Neurology

## 2023-04-24 MED ORDER — XYWAV 500 MG/ML PO SOLN: ORAL | 5 refills | Status: DC

## 2023-04-30 ENCOUNTER — Telehealth: Payer: Self-pay | Admitting: Adult Health

## 2023-04-30 MED ORDER — AMPHETAMINE-DEXTROAMPHETAMINE 20 MG PO TABS: ORAL_TABLET | ORAL | 0 refills | Status: DC

## 2023-04-30 NOTE — Telephone Encounter (Signed)
Pt called needing her amphetamine-dextroamphetamine (ADDERALL) 20 MG tablet called in to the [facility] on [address]

## 2023-06-05 DIAGNOSIS — Z0000 Encounter for general adult medical examination without abnormal findings: Secondary | ICD-10-CM | POA: Diagnosis not present

## 2023-06-06 DIAGNOSIS — Z0000 Encounter for general adult medical examination without abnormal findings: Secondary | ICD-10-CM | POA: Diagnosis not present

## 2023-06-06 DIAGNOSIS — Z13220 Encounter for screening for lipoid disorders: Secondary | ICD-10-CM | POA: Diagnosis not present

## 2023-06-11 ENCOUNTER — Telehealth: Payer: Self-pay

## 2023-06-11 MED ORDER — AMPHETAMINE-DEXTROAMPHETAMINE 20 MG PO TABS: ORAL_TABLET | ORAL | 0 refills | Status: DC

## 2023-06-11 NOTE — Telephone Encounter (Signed)
Last visit: 04/03/23  Next visit: 10/02/23  Per [place] registry, last fill:    From last visit:

## 2023-06-11 NOTE — Telephone Encounter (Signed)
Patient called in requesting a refill of amphetamine-dextroamphetamine (ADDERALL) 20 MG tablet . She is asking that this be sent to [facility] #[id number] - [place], [place] - [address] AT [facility] OF [address] & [facility] [address]

## 2023-07-17 ENCOUNTER — Other Ambulatory Visit: Payer: Self-pay | Admitting: Adult Health

## 2023-07-17 NOTE — Telephone Encounter (Signed)
 Pt called requesting a refill for her amphetamine-dextroamphetamine (ADDERALL) 20 MG tablet and needing it sent to the [facility] on [address]

## 2023-07-17 NOTE — Telephone Encounter (Signed)
 Last seen 04-03-2023, next appt 10-02-2023.  Last fill 06-12-2023 #75/ 30 days.

## 2023-07-18 MED ORDER — AMPHETAMINE-DEXTROAMPHETAMINE 20 MG PO TABS: ORAL_TABLET | ORAL | 0 refills | Status: DC

## 2023-07-27 ENCOUNTER — Telehealth: Payer: Self-pay | Admitting: Diagnostic Neuroimaging

## 2023-07-27 MED ORDER — XYWAV 500 MG/ML PO SOLN: ORAL | 5 refills | Status: DC

## 2023-07-27 NOTE — Telephone Encounter (Addendum)
 [facility] new phone number for XYREM/ XXYWAV [facility] drugs is [phone].  Can you correct this in EPIC /  pharmacy related info?   Melvyn Novas, MD

## 2023-07-27 NOTE — Telephone Encounter (Signed)
 Patient ran out of xywav 2 days early. Requesting early refill. Requesting Dr. Vickey Huger to refill. -VRP

## 2023-07-27 NOTE — Addendum Note (Signed)
 Addended by: Melvyn Novas on: 07/27/2023 03:48 PM   Modules accepted: Orders

## 2023-07-30 NOTE — Telephone Encounter (Signed)
 Called the pharmacy to ensure they received the script and per their pharmacist, it did come through. Pt was able to get the medication shipped out

## 2023-08-02 NOTE — Telephone Encounter (Signed)
 Irving Burton pharmacist with [facility] calling needing diagnosis code for pt (controlled narcolepsy G47.419).  she appreciated information.

## 2023-08-24 ENCOUNTER — Other Ambulatory Visit: Payer: Self-pay | Admitting: Adult Health

## 2023-08-24 NOTE — Telephone Encounter (Signed)
 Pt is requesting a refill for amphetamine-dextroamphetamine (ADDERALL) 20 MG tablet.  Pharmacy: [facility] [phone]

## 2023-08-27 ENCOUNTER — Telehealth: Payer: Self-pay | Admitting: Adult Health

## 2023-08-27 MED ORDER — AMPHETAMINE-DEXTROAMPHETAMINE 20 MG PO TABS: ORAL_TABLET | ORAL | 0 refills | Status: DC

## 2023-08-27 NOTE — Telephone Encounter (Signed)
 Last visit: 04/03/23   Next visit: 10/02/23   Per epic adherence registry, last fill:   From last visit:  ASSESSMENT AND PLAN 43 y.o. year old female  has a past medical history of Controlled narcolepsy (05/27/2013), Depression, EDS (Ehlers-Danlos syndrome), Hypersomnia, persistent, PTSD (post-traumatic stress disorder), and Recovering alcoholic (HCC). here with:    Narcolepsy    --Continue Xywav  --Continue Adderall 20 mg in the morning and at noon and a half a tablet at 4 PM -- Blood work being completed by her PCP.  -- Follow up in 6 months or sooner if needed.          Clem Currier, MSN, NP-C 04/03/2023, 2:37 PM [facility] [address] [place], [place] [address] [phone]    Request sent to Dr Dohmeier as Jacqlyn Matas NP is out of the office today.

## 2023-08-27 NOTE — Telephone Encounter (Signed)
 Pt has called to check on status of refill request.

## 2023-08-28 NOTE — Telephone Encounter (Signed)
 Error

## 2023-10-01 NOTE — Progress Notes (Unsigned)
 PATIENT: Kavina Cantave DOB: 14-Apr-1981  REASON FOR VISIT: follow up HISTORY FROM: patient  Virtual Visit via Video Note  I connected with Timmie Footman on 10/02/23  at  8:15 AM EDT by a video enabled telemedicine application located remotely at [facility] and verified that I am speaking with the correct person using two identifiers who was located at their own home in [place].    I discussed the limitations of evaluation and management by telemedicine and the availability of in person appointments. The patient expressed understanding and agreed to proceed.   PATIENT: Reana Chacko DOB: [birth year]  REASON FOR VISIT: follow up HISTORY FROM: patient  HISTORY OF PRESENT ILLNESS: Today 10/02/23   Hermena Swint is a 43 y.o. female with a history of Narcolepsy. Returns today for follow-up. Overall she is doing well. She is on xywav  and adderall.  She states she has been busy at work.  She has noticed some sleepiness in the afternoon but she relates this to her job.  Reports that she just recently had a physical.  Reports her blood pressure was in normal range.  Denies any new medical history.  Denies any changes in her mood or behavior.  No suicidal thoughts.  Denies worsening depression.  Returns today for an evaluation.   04/03/23: Sahithi Ordoyne is a 43 y.o. female with a history of narcolepsy. Returns today for follow-up.  She remains on Xywav  and Adderall.  The combination continues to work well for her.  Denies any cataplectic events.  No change in her mood or behavior.  No suicidal thoughts.  09/05/22: Katrinia Straker is a 43 y.o. female with a history of Narcolepsy. Returns today for follow-up.  Continues on Xywav  and Adderall.  Reports that this is working well.  She is able to work and drive without difficulty.  Denies any cataplectic events.  No change in her mood or behavior.  No suicidal thoughts.  03/01/22: Ms. Loh is a 43 year old female with a  history of narcolepsy.  She returns today for follow-up.  She remains on Xywav  and Adderall.  The trouble staying awake while working or driving.  No change in her depression.  No suicidal thoughts.  Reports that she Often doesn't need the 3rd dose of adderall.    08/23/21: Ms. Bartosik is a 43 year old female with a history of narcolepsy.  She returns today for follow-up.  She is currently on Xywav  and Adderall.  She reports that the combination continues to work well for her.  Denies any trouble staying awake while at work or driving.  Denies worsening depression.  Returns today for an evaluation.     02/15/21: Ms. Mincy is a 43 year old female with a history of narcolepsy.  She returns today for follow-up.  She continues on Xyrem  and Adderall.  She reports that this medication continues to work well for her. Patient endorses that the medication continues to work well for her. She denies any excess sleepiness, jitters, decreased appetite, or trouble sleeping. 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 She denies worsening depression or thoughts of hurting herself or others at this time.      08/11/20: Ms. Babler is a 43 year old female with a history of narcolepsy.  She returns today for follow-up.  She continues on Xyrem  and Adderall.  She reports that this medication continues to work well for her.  She has a full-time job with no difficulty.  She drives without difficulty.  Denies any worsening of depression.  Denies suicidal thoughts.  She returns today for evaluation.   07/10/19: Ms. Noreen is a 43 year old female with a history of narcolepsy.  She returns today for follow-up.  She remains on Xyrem  and Adderall.  Reports that these are working well for her.  Denies any significant change in her depression.  Denies suicidal thoughts.  Denies falling asleep at work or while driving.  REVIEW OF SYSTEMS: Out of a complete 14 system review of symptoms, the patient complains  only of the following symptoms, and all other reviewed systems are negative.  ALLERGIES: Allergies  Allergen Reactions   Bee Venom     HOME MEDICATIONS: Outpatient Medications Prior to Visit  Medication Sig Dispense Refill   amphetamine -dextroamphetamine  (ADDERALL) 20 MG tablet Take one tablet in AM, one tablet at 12 noon, and one half tablet at 4 pm. 75 tablet 0   buPROPion (WELLBUTRIN XL) 150 MG 24 hr tablet 450 mg.   5   Ca, Mg, K, and Na Oxybates (XYWAV ) 500 MG/ML SOLN Take 4.5 G twice nightly 540 mL 5   lamoTRIgine  (LAMICTAL ) 100 MG tablet TAKE 1 TABLET(100 MG) BY MOUTH DAILY (Patient taking differently: Take 200 mg by mouth 2 (two) times daily. 100mg  BID) 30 tablet 0   levonorgestrel (MIRENA) 20 MCG/24HR IUD 1 each by Intrauterine route once.     loratadine (CLARITIN) 10 MG tablet Take 10 mg by mouth daily.     Vilazodone HCl (VIIBRYD) 40 MG TABS Take 40 mg by mouth daily.     No facility-administered medications prior to visit.    PAST MEDICAL HISTORY: Past Medical History:  Diagnosis Date   Controlled narcolepsy 05/27/2013   Depression    EDS (Ehlers-Danlos syndrome)    persistent excessive daytime sleepiness   Hypersomnia, persistent    MSLT no SREM but lass than 8 minutes SL.    PTSD (post-traumatic stress disorder)    Recovering alcoholic (HCC)     PAST SURGICAL HISTORY: Past Surgical History:  Procedure Laterality Date   LASIK Right 6/12   good success- vision correction    FAMILY HISTORY: Family History  Problem Relation Age of Onset   Parkinson's disease Maternal Grandmother    Drug abuse Maternal Grandfather    Cancer Paternal Grandmother    Cancer Paternal Grandfather    Drug abuse Paternal Grandfather    Narcolepsy Neg Hx     SOCIAL HISTORY: Social History   Socioeconomic History   Marital status: Single    Spouse name: Not on file   Number of children: 0   Years of education: Bachelors   Highest education level: Not on file  Occupational  History    Employer: [employer]  Tobacco Use   Smoking status: Former   Smokeless tobacco: Never   Tobacco comments:    quit smoking 3/10  Vaping Use   Vaping status: Never Used  Substance and Sexual Activity   Alcohol use: No    Comment: quit alcohol 02/2003-recovering alcohol abuser   Drug use: No   Sexual activity: Not on file  Other Topics Concern   Not on file  Social History Narrative       Patient is single, recovering ETOH abuser. Patient has her bachelors degree  Patient has her masters from [employer].. 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    Patient is right-handed.   Patient does not drink any caffeine.            Social  Drivers of Corporate investment banker Strain: Not on file  Food Insecurity: Not on file  Transportation Needs: Not on file  Physical Activity: Not on file  Stress: Not on file  Social Connections: Unknown (11/15/2022)   Received from [facility]   Social Network    Social Network: Not on file  Intimate Partner Violence: Unknown (11/15/2022)   Received from [facility]   HITS    Physically Hurt: Not on file    Insult or Talk Down To: Not on file    Threaten Physical Harm: Not on file    Scream or Curse: Not on file      PHYSICAL EXAM Generalized: Well developed, in no acute distress   Neurological examination  Mentation: Alert oriented to time, place, history taking. Follows all commands speech and language fluent Cranial nerve II-XII: Facial symmetry noted  DIAGNOSTIC DATA (LABS, IMAGING, TESTING) - I reviewed patient records, labs, notes, testing and imaging myself where available.  Lab Results  Component Value Date   WBC 5.7 01/09/2019   HGB 12.9 01/09/2019   HCT 38.6 01/09/2019   MCV 95 01/09/2019   PLT 310 01/09/2019      Component Value Date/Time   NA 139 08/11/2020 0907   K 4.4 08/11/2020 0907   CL 100 08/11/2020 0907   CO2 24 08/11/2020 0907   GLUCOSE 103 (H) 08/11/2020 0907   BUN 14 08/11/2020 0907    CREATININE 1.05 (H) 08/11/2020 0907   CALCIUM 9.7 08/11/2020 0907   PROT 7.1 08/11/2020 0907   ALBUMIN 4.4 08/11/2020 0907   AST 13 08/11/2020 0907   ALT 15 08/11/2020 0907   ALKPHOS 62 08/11/2020 0907   BILITOT 0.3 08/11/2020 0907   GFRNONAA 72 02/10/2020 0912   GFRAA 83 02/10/2020 0912      ASSESSMENT AND PLAN 43 y.o. year old female  has a past medical history of Controlled narcolepsy (05/27/2013), Depression, EDS (Ehlers-Danlos syndrome), Hypersomnia, persistent, PTSD (post-traumatic stress disorder), and Recovering alcoholic (HCC). here with:   Narcolepsy   --Continue Xywav  --Continue Adderall 20 mg in the morning and at noon and a half a tablet at 4 PM -- Blood work being completed by her PCP- reviewed in care everywhere -- Follow up in 6 months or sooner if needed.      Clem Currier, MSN, NP-C 10/01/2023, 8:09 AM [facility] [address] [place], [place] [address] [phone]

## 2023-10-02 ENCOUNTER — Telehealth: Payer: BC Managed Care – PPO | Admitting: Adult Health

## 2023-10-02 DIAGNOSIS — G47419 Narcolepsy without cataplexy: Secondary | ICD-10-CM

## 2023-10-02 MED ORDER — AMPHETAMINE-DEXTROAMPHETAMINE 20 MG PO TABS: ORAL_TABLET | ORAL | 0 refills | Status: DC

## 2023-10-02 NOTE — Addendum Note (Signed)
 Addended by: Curry Dove on: 10/02/2023 11:39 AM   Modules accepted: Orders

## 2023-10-02 NOTE — Patient Instructions (Signed)
--  Continue Xywav  --Continue Adderall 20 mg in the morning and at noon and a half a tablet at 4 PM

## 2023-11-05 ENCOUNTER — Telehealth: Payer: Self-pay | Admitting: Adult Health

## 2023-11-05 NOTE — Telephone Encounter (Signed)
 Pt is requesting a refill for amphetamine-dextroamphetamine (ADDERALL) 20 MG tablet.  Pharmacy: [facility] [phone]

## 2023-11-06 MED ORDER — AMPHETAMINE-DEXTROAMPHETAMINE 20 MG PO TABS: ORAL_TABLET | ORAL | 0 refills | Status: DC

## 2023-12-10 ENCOUNTER — Other Ambulatory Visit: Payer: Self-pay | Admitting: Adult Health

## 2023-12-10 MED ORDER — AMPHETAMINE-DEXTROAMPHETAMINE 20 MG PO TABS: ORAL_TABLET | ORAL | 0 refills | Status: DC

## 2023-12-10 NOTE — Telephone Encounter (Signed)
 Last visit: 10/02/23  Next visit: 06/04/24    From last visit:

## 2023-12-10 NOTE — Telephone Encounter (Signed)
 Pt called to request refill om medication amphetamine -dextroamphetamine  (ADDERALL) 20 MG tablet   Pt would like medication to go to   [facility] #[id number] [place] [place], [place] - [address] AT [facility] [address] (Ph: [phone])

## 2023-12-18 DIAGNOSIS — Z1231 Encounter for screening mammogram for malignant neoplasm of breast: Secondary | ICD-10-CM | POA: Diagnosis not present

## 2023-12-18 DIAGNOSIS — Z1331 Encounter for screening for depression: Secondary | ICD-10-CM | POA: Diagnosis not present

## 2023-12-18 DIAGNOSIS — Z01419 Encounter for gynecological examination (general) (routine) without abnormal findings: Secondary | ICD-10-CM | POA: Diagnosis not present

## 2023-12-25 DIAGNOSIS — Z3202 Encounter for pregnancy test, result negative: Secondary | ICD-10-CM | POA: Diagnosis not present

## 2023-12-25 DIAGNOSIS — Z30433 Encounter for removal and reinsertion of intrauterine contraceptive device: Secondary | ICD-10-CM | POA: Diagnosis not present

## 2024-01-01 ENCOUNTER — Other Ambulatory Visit: Payer: Self-pay | Admitting: Neurology

## 2024-01-01 MED ORDER — XYWAV 500 MG/ML PO SOLN: ORAL | 5 refills | Status: AC

## 2024-01-02 DIAGNOSIS — R92341 Mammographic extreme density, right breast: Secondary | ICD-10-CM | POA: Diagnosis not present

## 2024-01-02 DIAGNOSIS — R928 Other abnormal and inconclusive findings on diagnostic imaging of breast: Secondary | ICD-10-CM | POA: Diagnosis not present

## 2024-01-15 ENCOUNTER — Other Ambulatory Visit: Payer: Self-pay | Admitting: Adult Health

## 2024-01-15 NOTE — Telephone Encounter (Signed)
 Last visit: 10/02/23  Next visit: 06/04/24  Per epic adherence report, last fill:    Rx sent to MM NP for e-scribe

## 2024-01-15 NOTE — Telephone Encounter (Signed)
 Patient request refill for amphetamine -dextroamphetamine  (ADDERALL) 20 MG tablet send to  [facility] [phone]

## 2024-01-16 MED ORDER — AMPHETAMINE-DEXTROAMPHETAMINE 20 MG PO TABS: ORAL_TABLET | ORAL | 0 refills | Status: DC

## 2024-01-17 NOTE — Telephone Encounter (Signed)
 Pt is following up on the refill request from 9/9

## 2024-01-30 DIAGNOSIS — Z30431 Encounter for routine checking of intrauterine contraceptive device: Secondary | ICD-10-CM | POA: Diagnosis not present

## 2024-02-27 ENCOUNTER — Other Ambulatory Visit: Payer: Self-pay | Admitting: Adult Health

## 2024-02-27 MED ORDER — AMPHETAMINE-DEXTROAMPHETAMINE 20 MG PO TABS: ORAL_TABLET | ORAL | 0 refills | Status: DC

## 2024-02-27 NOTE — Telephone Encounter (Signed)
 Pt is requesting a refill for amphetamine-dextroamphetamine (ADDERALL) 20 MG tablet.  Pharmacy: [facility] [phone]

## 2024-02-27 NOTE — Telephone Encounter (Signed)
 Last visit: 10/02/23  Next visit: 06/04/24  Last 2 fills:    From last visit with Duwaine NP: Instructions  --Continue Xywav  --Continue Adderall 20 mg in the morning and at noon and a half a tablet at 4 PM       Rx request sent to Dr Chalice since Duwaine NP is out of office.

## 2024-03-31 ENCOUNTER — Other Ambulatory Visit: Payer: Self-pay | Admitting: Adult Health

## 2024-03-31 MED ORDER — AMPHETAMINE-DEXTROAMPHETAMINE 20 MG PO TABS: ORAL_TABLET | ORAL | 0 refills | Status: DC

## 2024-03-31 NOTE — Telephone Encounter (Signed)
 Last refilled by patient on : 02/27/24 Last office visit : 10/02/23 Next office visit : 06/04/24 Per last office note - -Continue Adderall 20 mg in the morning and at noon and a half a tablet at 4 PM

## 2024-03-31 NOTE — Telephone Encounter (Signed)
Patient has been notified via voicemail.

## 2024-03-31 NOTE — Telephone Encounter (Signed)
 Pt is needing a refill request for her amphetamine -dextroamphetamine  (ADDERALL) 20 MG tablet sent in to the [facility] on [address]

## 2024-04-01 ENCOUNTER — Telehealth: Payer: Self-pay

## 2024-04-01 NOTE — Telephone Encounter (Signed)
 Pharmacy Patient Advocate Encounter   Received notification from CoverMyMeds that prior authorization for Xywav  is required/requested.   Insurance verification completed.   The patient is insured through [facility].   Per test claim: PA required; PA submitted to above mentioned insurance via Latent Key/confirmation #/EOC [id number] Status is pending

## 2024-04-04 ENCOUNTER — Other Ambulatory Visit (HOSPITAL_COMMUNITY): Payer: Self-pay

## 2024-04-04 NOTE — Telephone Encounter (Signed)
 Pharmacy Patient Advocate Encounter  Received notification from [facility] that Prior Authorization for Xywav   has been APPROVED from 04/01/2024 to 04/01/2025. Unable to obtain price due to refill too soon rejection, last fill date 03/21/2024 next available fill date12/01/2024   PA #/Case ID/Reference #: [id number]

## 2024-05-12 ENCOUNTER — Other Ambulatory Visit: Payer: Self-pay | Admitting: Adult Health

## 2024-05-12 MED ORDER — AMPHETAMINE-DEXTROAMPHETAMINE 20 MG PO TABS: ORAL_TABLET | ORAL | 0 refills | Status: AC

## 2024-05-12 NOTE — Telephone Encounter (Signed)
 Sent refill request to Megan,NP for approval this am and LVM to make patient aware

## 2024-05-12 NOTE — Telephone Encounter (Signed)
 Pt called to request Medication refill    amphetamine -dextroamphetamine  (ADDERALL) 20 MG tablet  Pt medication is to be sent to   [id number] [address], [place] - [address] AT [facility] OF [address] (Ph: [phone])

## 2024-05-29 ENCOUNTER — Telehealth: Payer: Self-pay | Admitting: Adult Health

## 2024-05-29 NOTE — Telephone Encounter (Signed)
 LVM and sent mychart informing of reschedule needed for 1/28 NP OUT

## 2024-06-04 ENCOUNTER — Ambulatory Visit: Admitting: Adult Health

## 2024-06-11 ENCOUNTER — Encounter: Payer: Self-pay | Admitting: Adult Health

## 2024-06-11 ENCOUNTER — Ambulatory Visit: Admitting: Adult Health

## 2024-06-11 VITALS — BP 123/82 | HR 90 | Ht 63.0 in | Wt 156.8 lb

## 2024-06-11 DIAGNOSIS — G47419 Narcolepsy without cataplexy: Secondary | ICD-10-CM | POA: Diagnosis not present

## 2024-06-11 NOTE — Progress Notes (Signed)
 "     PATIENT: Sherry Rhodes DOB: February 22, 1981  REASON FOR VISIT: follow up HISTORY FROM: patient  HISTORY OF PRESENT ILLNESS: Today 06/11/24:  Sherry Rhodes is a 44 y.o. female with a history of narcolepsy. Returns today for follow-up.  Overall she continues to do well on Xywav  and Adderall.  Denies any change in her mood or behavior.  No suicidal thoughts.  Reports that depression is under control. Denies any new medical history. Returns today for follow-up.   10/02/23: Sherry Rhodes is a 44 y.o. female with a history of Narcolepsy. Returns today for follow-up. Overall she is doing well. She is on xywav  and adderall.  She states she has been busy at work.  She has noticed some sleepiness in the afternoon but she relates this to her job.  Reports that she just recently had a physical.  Reports her blood pressure was in normal range.  Denies any new medical history.  Denies any changes in her mood or behavior.  No suicidal thoughts.  Denies worsening depression.  Returns today for an evaluation.   04/03/23: Sherry Rhodes is a 44 y.o. female with a history of narcolepsy. Returns today for follow-up.  She remains on Xywav  and Adderall.  The combination continues to work well for her.  Denies any cataplectic events.  No change in her mood or behavior.  No suicidal thoughts.  09/05/22: Sherry Rhodes is a 44 y.o. female with a history of Narcolepsy. Returns today for follow-up.  Continues on Xywav  and Adderall.  Reports that this is working well.  She is able to work and drive without difficulty.  Denies any cataplectic events.  No change in her mood or behavior.  No suicidal thoughts.  03/01/22: Sherry Rhodes is a 44 year old female with a history of narcolepsy.  She returns today for follow-up.  She remains on Xywav  and Adderall.  The trouble staying awake while working or driving.  No change in her depression.  No suicidal thoughts.  Reports that she Often doesn't need the 3rd dose of  adderall.    08/23/21: Sherry Rhodes is a 44 year old female with a history of narcolepsy.  She returns today for follow-up.  She is currently on Xywav  and Adderall.  She reports that the combination continues to work well for her.  Denies any trouble staying awake while at work or driving.  Denies worsening depression.  Returns today for an evaluation.     02/15/21: Sherry Rhodes is a 44 year old female with a history of narcolepsy.  She returns today for follow-up.  She continues on Xyrem  and Adderall.  She reports that this medication continues to work well for her. Patient endorses that the medication continues to work well for her. She denies any excess sleepiness, jitters, decreased appetite, or trouble sleeping. 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 She denies worsening depression or thoughts of hurting herself or others at this time.      08/11/20: Sherry Rhodes is a 44 year old female with a history of narcolepsy.  She returns today for follow-up.  She continues on Xyrem  and Adderall.  She reports that this medication continues to work well for her.  She has a full-time job with no difficulty.  She drives without difficulty.  Denies any worsening of depression.  Denies suicidal thoughts.  She returns today for evaluation.   07/10/19: Sherry Rhodes is a 44 year old female with a history of narcolepsy.  She returns today for follow-up.  She remains  on Xyrem  and Adderall.  Reports that these are working well for her.  Denies any significant change in her depression.  Denies suicidal thoughts.  Denies falling asleep at work or while driving.  REVIEW OF SYSTEMS: Out of a complete 14 system review of symptoms, the patient complains only of the following symptoms, and all other reviewed systems are negative.  ALLERGIES: Allergies  Allergen Reactions   Bee Venom     HOME MEDICATIONS: Outpatient Medications Prior to Visit  Medication Sig Dispense Refill    amphetamine -dextroamphetamine  (ADDERALL) 20 MG tablet Take one tablet in AM, one tablet at 12 noon, and one half tablet at 4 pm. 75 tablet 0   buPROPion (WELLBUTRIN XL) 150 MG 24 hr tablet 450 mg.   5   Ca, Mg, K, and Na Oxybates (XYWAV ) 500 MG/ML SOLN Take 4.5 G twice nightly 540 mL 5   lamoTRIgine  (LAMICTAL ) 100 MG tablet TAKE 1 TABLET(100 MG) BY MOUTH DAILY (Patient taking differently: Take 200 mg by mouth 2 (two) times daily. 100mg  BID) 30 tablet 0   levonorgestrel  (MIRENA ) 20 MCG/24HR IUD 1 each by Intrauterine route once.     loratadine (CLARITIN) 10 MG tablet Take 10 mg by mouth daily.     Vilazodone HCl (VIIBRYD) 40 MG TABS Take 40 mg by mouth daily.     No facility-administered medications prior to visit.    PAST MEDICAL HISTORY: Past Medical History:  Diagnosis Date   Controlled narcolepsy 05/27/2013   Depression    EDS (Ehlers-Danlos syndrome)    persistent excessive daytime sleepiness   Hypersomnia, persistent    MSLT no SREM but lass than 8 minutes SL.    PTSD (post-traumatic stress disorder)    Recovering alcoholic (HCC)     PAST SURGICAL HISTORY: Past Surgical History:  Procedure Laterality Date   LASIK Right 6/12   good success- vision correction    FAMILY HISTORY: Family History  Problem Relation Age of Onset   Parkinson's disease Maternal Grandmother    Drug abuse Maternal Grandfather    Cancer Paternal Grandmother    Cancer Paternal Grandfather    Drug abuse Paternal Grandfather    Narcolepsy Neg Hx     SOCIAL HISTORY: Social History   Socioeconomic History   Marital status: Single    Spouse name: Not on file   Number of children: 0   Years of education: Bachelors   Highest education level: Not on file  Occupational History    Employer: [employer]  Tobacco Use   Smoking status: Former   Smokeless tobacco: Never   Tobacco comments:    quit smoking 3/10  Vaping Use   Vaping status: Never Used  Substance and Sexual Activity   Alcohol  use: No    Comment: quit alcohol 02/2003-recovering alcohol abuser   Drug use: No   Sexual activity: Not on file  Other Topics Concern   Not on file  Social History Narrative       Patient is single, recovering ETOH abuser. Patient has her bachelors degree  Patient has her masters from [employer].. 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    Patient is right-handed.   Patient does not drink any caffeine.   Patient lives alone.         Social Drivers of Health   Tobacco Use: Medium Risk (06/11/2024)   Patient History    Smoking Tobacco Use: Former    Smokeless Tobacco Use: Never    Passive Exposure: Not on file  Financial Resource Strain: Not on file  Food Insecurity: Not on file  Transportation Needs: Not on file  Physical Activity: Not on file  Stress: Not on file  Social Connections: Not on file  Intimate Partner Violence: Not on file  Depression (EYV7-0): Not on file  Alcohol Screen: Not on file  Housing: Not on file  Utilities: Not on file  Health Literacy: Not on file      PHYSICAL EXAM Generalized: Well developed, in no acute distress   Neurological examination  Mentation: Alert oriented to time, place, history taking. Follows all commands speech and language fluent Cranial nerve II-XII: Facial symmetry noted  DIAGNOSTIC DATA (LABS, IMAGING, TESTING) - I reviewed patient records, labs, notes, testing and imaging myself where available.  Lab Results  Component Value Date   WBC 5.7 01/09/2019   HGB 12.9 01/09/2019   HCT 38.6 01/09/2019   MCV 95 01/09/2019   PLT 310 01/09/2019      Component Value Date/Time   NA 139 08/11/2020 0907   K 4.4 08/11/2020 0907   CL 100 08/11/2020 0907   CO2 24 08/11/2020 0907   GLUCOSE 103 (H) 08/11/2020 0907   BUN 14 08/11/2020 0907   CREATININE 1.05 (H) 08/11/2020 0907   CALCIUM 9.7 08/11/2020 0907   PROT 7.1 08/11/2020 0907   ALBUMIN 4.4 08/11/2020 0907   AST 13 08/11/2020 0907   ALT 15 08/11/2020 0907   ALKPHOS 62  08/11/2020 0907   BILITOT 0.3 08/11/2020 0907   GFRNONAA 72 02/10/2020 0912   GFRAA 83 02/10/2020 0912      ASSESSMENT AND PLAN 44 y.o. year old female  has a past medical history of Controlled narcolepsy (05/27/2013), Depression, EDS (Ehlers-Danlos syndrome), Hypersomnia, persistent, PTSD (post-traumatic stress disorder), and Recovering alcoholic (HCC). here with:   Narcolepsy   --Continue Xywav  --Continue Adderall 20 mg in the morning and at noon and a half a tablet at 4 PM -- Blood work being completed by her PCP- reviewed in care everywhere -- Follow up in 6 months or sooner if needed.      Duwaine Russell, MSN, NP-C 06/11/2024, 10:12 AM [facility] [address], [address] [place], [place] [address] [phone]  The patient's condition requires frequent monitoring and adjustments in the treatment plan, reflecting the ongoing complexity of care.  This provider is the continuing focal point for all needed services for this condition.  "

## 2024-06-11 NOTE — Patient Instructions (Signed)
 Your Plan:  Continue xywav  and adderall  If your symptoms worsen or you develop new symptoms please let us  know.       Thank you for coming to see us  at [facility]. I hope we have been able to provide you high quality care today.  You may receive a patient satisfaction survey over the next few weeks. We would appreciate your feedback and comments so that we may continue to improve ourselves and the health of our patients.

## 2025-02-17 ENCOUNTER — Telehealth: Admitting: Adult Health
